# Patient Record
Sex: Male | Born: 1960 | Race: White | Hispanic: No | Marital: Single | Smoking: Former smoker
Health system: Southern US, Academic
[De-identification: ages and names within clinical notes are randomized; demographics above are authoritative.]

## PROBLEM LIST (undated history)

## (undated) DIAGNOSIS — G8929 Other chronic pain: Secondary | ICD-10-CM

## (undated) DIAGNOSIS — I10 Essential (primary) hypertension: Secondary | ICD-10-CM

## (undated) DIAGNOSIS — G4730 Sleep apnea, unspecified: Secondary | ICD-10-CM

## (undated) DIAGNOSIS — Z973 Presence of spectacles and contact lenses: Secondary | ICD-10-CM

## (undated) DIAGNOSIS — M129 Arthropathy, unspecified: Secondary | ICD-10-CM

## (undated) DIAGNOSIS — R569 Unspecified convulsions: Secondary | ICD-10-CM

## (undated) DIAGNOSIS — S82899A Other fracture of unspecified lower leg, initial encounter for closed fracture: Secondary | ICD-10-CM

## (undated) HISTORY — PX: HX GALL BLADDER SURGERY/CHOLE: SHX55

## (undated) HISTORY — DX: Other chronic pain: G89.29

## (undated) HISTORY — PX: HX KNEE REPLACMENT: SHX125

## (undated) HISTORY — PX: HX APPENDECTOMY: SHX54

---

## 1999-07-11 ENCOUNTER — Emergency Department (HOSPITAL_COMMUNITY): Payer: Self-pay

## 2010-07-30 ENCOUNTER — Ambulatory Visit (INDEPENDENT_AMBULATORY_CARE_PROVIDER_SITE_OTHER): Payer: Self-pay | Admitting: Neurology

## 2010-07-30 ENCOUNTER — Ambulatory Visit (INDEPENDENT_AMBULATORY_CARE_PROVIDER_SITE_OTHER): Payer: No Typology Code available for payment source | Admitting: Neurology

## 2021-07-22 ENCOUNTER — Encounter (HOSPITAL_COMMUNITY): Payer: Self-pay

## 2021-07-22 ENCOUNTER — Other Ambulatory Visit: Payer: Self-pay

## 2021-07-22 ENCOUNTER — Inpatient Hospital Stay
Admission: EM | Admit: 2021-07-22 | Discharge: 2021-07-23 | DRG: 101 | Discharge status: Against Medical Advice | Payer: BC Managed Care – PPO | Attending: Internal Medicine | Admitting: Internal Medicine

## 2021-07-22 DIAGNOSIS — Z87891 Personal history of nicotine dependence: Secondary | ICD-10-CM

## 2021-07-22 DIAGNOSIS — G4730 Sleep apnea, unspecified: Secondary | ICD-10-CM | POA: Diagnosis present

## 2021-07-22 DIAGNOSIS — Z20822 Contact with and (suspected) exposure to covid-19: Secondary | ICD-10-CM | POA: Diagnosis present

## 2021-07-22 DIAGNOSIS — I10 Essential (primary) hypertension: Secondary | ICD-10-CM | POA: Diagnosis present

## 2021-07-22 DIAGNOSIS — R569 Unspecified convulsions: Principal | ICD-10-CM | POA: Diagnosis present

## 2021-07-22 HISTORY — DX: Sleep apnea, unspecified: G47.30

## 2021-07-22 HISTORY — DX: Presence of spectacles and contact lenses: Z97.3

## 2021-07-22 HISTORY — DX: Essential (primary) hypertension: I10

## 2021-07-22 HISTORY — DX: Unspecified convulsions (CMS HCC): R56.9

## 2021-07-22 HISTORY — DX: Arthropathy, unspecified: M12.9

## 2021-07-22 NOTE — ED Attending Note (Signed)
[facility]  Emergency Department  Attending Provider Note      CHIEF COMPLAINT  Chief Complaint   Patient presents with    Seizure- New Onset    Nausea    Light Sensitivity     HISTORY OF PRESENT ILLNESS  Steven Mendez, date of birth 10-09-1960, is a 61 y.o. male who presented to the Emergency Department after he had seizure-like activity at home.  Family reports patient has had multiple episodes over the past few months.  The patient describes a left-sided headache that precedes the activity.  He will then lose consciousness briefly.  Recently, he has began to have shaking in his left hand and arm.  He does appear to have a postictal phase after the seizure activity stopped.  Family reports the patient remains unresponsive for a few minutes.  Apparently, the patient is awake and alert.  He reports 2-3 hours of being very tired after each episode.  He states he has had a CT and MRI in the past month that were both normal.  He states he also had an EEG 1 week ago but is unaware of the results.  He is not currently taking any medications.    PAST MEDICAL/SURGICAL/FAMILY/SOCIAL HISTORY  Past Medical History:   Diagnosis Date    Arthropathy     Convulsions (CMS HCC)     HTN (hypertension)     Sleep apnea     Wears glasses        Past Surgical History:   Procedure Laterality Date    HX APPENDECTOMY      HX CHOLECYSTECTOMY      HX KNEE REPLACMENT Left        Family Medical History:    None       Social History     Socioeconomic History    Marital status: Single   Tobacco Use    Smoking status: Former     Packs/day: 1.00     Types: Cigarettes     Start date: 1982     Quit date: 2021     Years since quitting: 2.1    Smokeless tobacco: Never   Vaping Use    Vaping Use: Never used   Substance and Sexual Activity    Alcohol use: Yes     Comment: occasional    Drug use: Never    Sexual activity: Not Currently      ALLERGIES  No Known Allergies    PHYSICAL EXAM  VITAL SIGNS:  Filed  Vitals:    07/22/21 2057   BP: 134/85   Pulse: 79   Resp: 18   Temp: 37.2 C (98.9 F)   SpO2: 96%     GENERAL: PATIENT IS ALERT AND ORIENTED TO PERSON, PLACE, AND TIME.  HEAD: NORMOCEPHALIC AND ATRAUMATIC.  EYES: PUPILS EQUALLY ROUND AND REACT TO LIGHT. EXTRAOCULAR MOVEMENTS INTACT.  EARS: GROSS HEARING INTACT. EXTERNAL EARS WITHIN NORMAL LIMITS.  NOSE: NO SEPTAL DEVIATION. NASAL PASSAGES CLEAR.  THROAT: MOIST ORAL MUCOSA. NO ERYTHEMA OR EXUDATE OF THE PHARYNX.  NECK: SUPPLE. TRACHEA MIDLINE.  HEART: REGULAR, RATE, AND RHYTHM.  LUNGS: CLEAR TO AUSCULTATION BILATERAL.  ABDOMEN: SOFT, NON-TENDER, NON-DISTENDED, AND BOWEL SOUNDS ARE PRESENT.  GENITOURINARY: DEFERRED.  RECTAL: DEFERRED.  EXTREMITIES: NO CYANOSIS, CLUBBING, OR EDEMA.  SKIN: WARM AND DRY.  MUSCULOSKELETAL: DEFERRED.  NEUROLOGIC: CRANIAL NERVES II THROUGH XII ARE INTACT. SENSATION TO LIGHT TOUCH IS INTACT.  Strength 5/5 in all 4 extremities.  There is no pronator drift.  The patient does have a very slight horizontal nystagmus with rapid component to the right. Finger-to-nose ataxia with bilat.    PSYCHIATRIC: JUDGMENT AND INSIGHT ARE SEEMINGLY INTACT. MOOD AND AFFECT ARE APPROPRIATE FOR THE SITUATION.    DIAGNOSTICS  Labs:  Labs listed below were reviewed and interpreted by me.  Results for orders placed or performed during the hospital encounter of 07/22/21   COMPREHENSIVE METABOLIC PANEL, NON-FASTING   Result Value Ref Range    SODIUM 138 136 - 145 mmol/L    POTASSIUM 3.9 3.5 - 5.1 mmol/L    CHLORIDE 104 98 - 107 mmol/L    CO2 TOTAL 24 21 - 31 mmol/L    ANION GAP 10 10 - 20 mmol/L    BUN 13 7 - 25 mg/dL    CREATININE 0.76 0.60 - 1.30 mg/dL    BUN/CREA RATIO 17 6 - 22    ESTIMATED GFR 103 >59 mL/min/1.49m2    ALBUMIN 4.3 3.5 - 5.7 g/dL    CALCIUM 9.2 8.6 - 10.3 mg/dL    GLUCOSE 97 74 - 109 mg/dL    ALKALINE PHOSPHATASE 74 34 - 104 U/L    ALT (SGPT) 22 7 - 52 U/L    AST (SGOT) 17 13 - 39 U/L    BILIRUBIN TOTAL 0.7 0.3 - 1.2 mg/dL    PROTEIN TOTAL 7.1  6.4 - 8.9 g/dL    ALBUMIN/GLOBULIN RATIO 1.5 (H) 0.8 - 1.4    OSMOLALITY, CALCULATED 276 270 - 290 mOsm/kg    CALCIUM, CORRECTED 8.9 8.9 - 10.8 mg/dL    GLOBULIN 2.8 (L) 2.9 - 5.4   MAGNESIUM   Result Value Ref Range    MAGNESIUM 1.9 1.9 - 2.7 mg/dL   CREATINE KINASE (CK), TOTAL, SERUM   Result Value Ref Range    CREATINE KINASE 107 30 - 223 U/L   ECG 12 LEAD   Result Value Ref Range    Ventricular rate 65 BPM    Atrial Rate 65 BPM    PR Interval 194 ms    QRS Duration 88 ms    QT Interval 382 ms    QTC Calculation 397 ms    Calculated P Axis 69 degrees    Calculated R Axis 72 degrees    Calculated T Axis 48 degrees                  Medical Decision Making  Amount and/or Complexity of Data Reviewed  Independent Historian: spouse  Labs: ordered.     Details: CT and MRI results requested but not obtained.  Radiology: ordered.  ECG/medicine tests: ordered.      Risk  Decision regarding hospitalization.        CLINICAL IMPRESSION  Clinical Impression   Seizure (CMS [facility]) (Primary)     DISPOSITION  Admitted       DISCHARGE MEDICATIONS  Current Discharge Medication List          Quita Skye Sabra Heck D.O.   07/22/2021, 23:34   [facility]  Department of Emergency Medicine  [facility]    This note was partially generated using MModal Fluency Direct system, and there may be some incorrect words, spellings, and punctuation that were not noted in checking the note before saving.    -----

## 2021-07-22 NOTE — ED Triage Notes (Signed)
Given 4mg  Zofran IV in route via EMS

## 2021-07-22 NOTE — ED Triage Notes (Addendum)
Brought to ED via [facility] for seizure like activity x6 months. Family reports pt has episodes of staring followed by confusion. PCP instructed pt to come to ED for workup for seizures. Pt is complaining of light sensitivity and nausea after episode tonight.

## 2021-07-23 DIAGNOSIS — M129 Arthropathy, unspecified: Secondary | ICD-10-CM

## 2021-07-23 DIAGNOSIS — R569 Unspecified convulsions: Secondary | ICD-10-CM | POA: Diagnosis present

## 2021-07-23 DIAGNOSIS — Z9049 Acquired absence of other specified parts of digestive tract: Secondary | ICD-10-CM

## 2021-07-23 DIAGNOSIS — Z87891 Personal history of nicotine dependence: Secondary | ICD-10-CM

## 2021-07-23 DIAGNOSIS — G4730 Sleep apnea, unspecified: Secondary | ICD-10-CM

## 2021-07-23 DIAGNOSIS — I10 Essential (primary) hypertension: Secondary | ICD-10-CM

## 2021-07-23 LAB — COMPREHENSIVE METABOLIC PANEL, NON-FASTING
ALBUMIN/GLOBULIN RATIO: 1.5 — ABNORMAL HIGH (ref 0.8–1.4)
ALBUMIN: 4.3 g/dL (ref 3.5–5.7)
ALKALINE PHOSPHATASE: 74 U/L (ref 34–104)
ALT (SGPT): 22 U/L (ref 7–52)
ANION GAP: 10 mmol/L (ref 10–20)
AST (SGOT): 17 U/L (ref 13–39)
BILIRUBIN TOTAL: 0.7 mg/dL (ref 0.3–1.2)
BUN/CREA RATIO: 17 (ref 6–22)
BUN: 13 mg/dL (ref 7–25)
CALCIUM, CORRECTED: 8.9 mg/dL (ref 8.9–10.8)
CALCIUM: 9.2 mg/dL (ref 8.6–10.3)
CHLORIDE: 104 mmol/L (ref 98–107)
CO2 TOTAL: 24 mmol/L (ref 21–31)
CREATININE: 0.76 mg/dL (ref 0.60–1.30)
ESTIMATED GFR: 103 mL/min/{1.73_m2} (ref >59)
GLOBULIN: 2.8 — ABNORMAL LOW (ref 2.9–5.4)
GLUCOSE: 97 mg/dL (ref 74–109)
OSMOLALITY, CALCULATED: 276 mOsm/kg (ref 270–290)
POTASSIUM: 3.9 mmol/L (ref 3.5–5.1)
PROTEIN TOTAL: 7.1 g/dL (ref 6.4–8.9)
SODIUM: 138 mmol/L (ref 136–145)

## 2021-07-23 LAB — CBC WITH DIFF
BASOPHIL #: 0.1 10*3/uL (ref 0.00–2.50)
BASOPHIL %: 1 % (ref 0–3)
EOSINOPHIL #: 0.2 10*3/uL (ref 0.00–2.40)
EOSINOPHIL %: 2 % (ref 0–7)
HCT: 44 % (ref 42.0–51.0)
HGB: 15.3 g/dL (ref 13.5–18.0)
LYMPHOCYTE #: 2.3 10*3/uL (ref 2.10–11.00)
LYMPHOCYTE %: 21 % — ABNORMAL LOW (ref 25–45)
MCH: 30.6 pg (ref 27.0–32.0)
MCHC: 34.7 g/dL (ref 32.0–36.0)
MCV: 88.2 fL (ref 78.0–99.0)
MONOCYTE #: 0.6 10*3/uL (ref 0.00–4.10)
MONOCYTE %: 6 % (ref 0–12)
MPV: 7.8 fL (ref 7.4–10.4)
NEUTROPHIL #: 7.5 10*3/uL (ref 4.10–29.00)
NEUTROPHIL %: 70 % (ref 40–76)
PLATELET COMMENT: NORMAL
PLATELETS: 272 10*3/uL (ref 140–440)
RBC COMMENT: NORMAL
RBC: 4.99 10*6/uL (ref 4.20–6.00)
RDW: 13.9 % (ref 11.6–14.8)
WBC: 10.8 10*3/uL — ABNORMAL HIGH (ref 4.0–10.5)
WBCS UNCORRECTED: 10.8 10*3/uL

## 2021-07-23 LAB — ECG 12 LEAD
Atrial Rate: 65 {beats}/min
Calculated P Axis: 69 degrees
Calculated R Axis: 72 degrees
Calculated T Axis: 48 degrees
PR Interval: 194 ms
QRS Duration: 88 ms
QT Interval: 382 ms
QTC Calculation: 397 ms
Ventricular rate: 65 {beats}/min

## 2021-07-23 LAB — COVID-19, FLU A/B, RSV RAPID BY PCR
INFLUENZA VIRUS TYPE A: NOT DETECTED
INFLUENZA VIRUS TYPE B: NOT DETECTED
RESPIRATORY SYNCTIAL VIRUS (RSV): NOT DETECTED
SARS-CoV-2: NOT DETECTED

## 2021-07-23 LAB — MAGNESIUM: MAGNESIUM: 1.9 mg/dL (ref 1.9–2.7)

## 2021-07-23 LAB — CREATINE KINASE (CK), TOTAL, SERUM: CREATINE KINASE: 107 U/L (ref 30–223)

## 2021-07-23 LAB — THYROID STIMULATING HORMONE WITH FREE T4 REFLEX: TSH: 3.905 u[IU]/mL (ref 0.450–5.330)

## 2021-07-23 MED ORDER — LEVETIRACETAM 500 MG TABLET
500.0000 mg | ORAL_TABLET | Freq: Two times a day (BID) | ORAL | Status: DC
Start: 2021-07-23 — End: 2021-07-23
  Administered 2021-07-23: 500 mg via ORAL

## 2021-07-23 MED ORDER — SODIUM CHLORIDE 0.9 % INTRAVENOUS SOLUTION
INTRAVENOUS | Status: DC
Start: 2021-07-23 — End: 2021-07-23

## 2021-07-23 MED ORDER — LEVETIRACETAM 500 MG TABLET
ORAL_TABLET | ORAL | Status: AC
Start: 2021-07-23 — End: 2021-07-23
  Filled 2021-07-23: qty 1

## 2021-07-23 NOTE — H&P (Signed)
[facility]  History and Physical    Date of Service:  07/23/21   Chandan, Fly, 61 y.o. male  Encounter Start Date:  07/22/2021  Inpatient Admission Date: 07/23/2021  Date of Birth:  Sep 07, 1960  PCP: Meryle Ready, MD    Chief Complaint:  Seizures    HPI: Steven Mendez is a 61 y.o., White male who presents with worsening onset of seizure-like activity.  Patient has been having these episodes for greater than 3 years.  Patient usually develops a left-sided headache and will have some left-sided arm movement and becomes acutely confused.  According the family it takes 45 minutes to an hour for him to come out of this confusion state.  Dr. Hyacinth Meeker had conversation with tele Neuro Dr. Francisco Capuchin from [facility].  Patient has had CT, MRI and EEG completed that [facility].  The ED is trying to have these records faxed over at present.  Tele Neuro would like to have these reports and do a complete walk around tele Neuro visit with patient in the a.m. once the reports are in hand.  Patient is agreeable to stay.  Patient to be loaded with Keppra and admitted to telemetry.    Past Medical History:    Past Medical History:   Diagnosis Date   . Arthropathy    . Convulsions (CMS HCC)    . HTN (hypertension)    . Sleep apnea    . Wears glasses              Medications Prior to Admission     Prescriptions    esomeprazole magnesium (NEXIUM) 40 mg Oral Capsule, Delayed Release(E.C.)    Take 1 Capsule (40 mg total) by mouth Every morning before breakfast    gabapentin (NEURONTIN) 400 mg Oral Capsule    Take 2 Capsules (800 mg total) by mouth Three times a day    lisinopriL (PRINIVIL) 20 mg Oral Tablet    Take 1 Tablet (20 mg total) by mouth Once a day    naproxen (NAPROSYN) 500 mg Oral Tablet    Take 1 Tablet (500 mg total) by mouth Twice daily with food    ondansetron (ZOFRAN ODT) 4 mg Oral Tablet, Rapid Dissolve    Take 1 Tablet (4 mg total) by mouth Every 8 hours as needed for Nausea/Vomiting    sucralfate  (CARAFATE) 1 gram Oral Tablet    Take 1 Tablet (1 g total) by mouth Every 6 hours        No Known Allergies    Past Surgical History:  Past Surgical History:   Procedure Laterality Date   . HX APPENDECTOMY     . HX CHOLECYSTECTOMY     . HX KNEE REPLACMENT Left            Family History:  Family Medical History:    None            Social History:  Social History     Tobacco Use   . Smoking status: Former     Packs/day: 1.00     Types: Cigarettes     Start date: 39     Quit date: 2021     Years since quitting: 2.1   . Smokeless tobacco: Never   Vaping Use   . Vaping Use: Never used   Substance Use Topics   . Alcohol use: Yes     Comment: occasional   . Drug use: Never  Review of Systems:  General: No fever or chills. No weight changes, fatigue, weakness.   HEENT: No headaches, dizziness, changes in vision, changes in hearing, or difficulty swallowing.    Skin:  No rashes, erythema or bruises.   Cardiac: No chest pain, palpitations, or arrhythmia.    Respiratory: No shortness of breath, cough, or wheezing.  GI: No nausea or vomiting. No abdominal pain.   Urinary: No dysuria, hematuria, or change in frequency.    Vascular: No edema.     Musculoskeletal: No muscle weakness, pain, or decreased range of motion.   Neurologic: No loss of sensation, numbness or tingling.   Endocrine: No heat or cold intolerance or polydipsia.   Psychiatric: No insomnia, depression or anxiety.  Examination:  BP 134/85   Pulse 79   Temp 37.2 C (98.9 F)   Resp 18   Ht 1.727 m (5\' 8" )   Wt 99.8 kg (220 lb)   SpO2 96%   BMI 33.45 kg/m       Filed Vitals:    07/22/21 2057   BP: 134/85   Pulse: 79   Resp: 18   Temp: 37.2 C (98.9 F)   SpO2: 96%      General: Patient is alert and oriented to person, place, and time. No acute distress. Communicates appropriately.   Head: Normocephalic and atraumatic.    Eyes: Pupils equally round and react to light and accommodate. Extraocular movements intact.  Conjunctiva normal. Sclerae are  normal.    Nose: Nasal passages clear. Mucosa moist.    Throat: Moist oral mucosa. No erythema or exudate of the pharynx. Clear oropharynx.    Neck: Supple. No cervical lymphadenopathy or supraclavicular nodes detected. Trachea midline   Heart: Regular rate and rhythm. S1 & S2 present. No S3 or S4. No rubs, gallops, or murmurs appreciated.  Radial and dorsalis pedis pulses +2/4 bilaterally.  Brisk capillary refill.    Lungs: Clear to auscultation bilaterally with no wheezes or rales. Equal chest excursion.  No conversational dyspnea. No respiratory distress noted.   Abdomen: Soft, nontender, nondistended belly. Bowel sounds are present in all four quadrants. No rigidity.  No guarding.  No ascites.   Extremities: No edema, cyanosis, or clubbing. Grossly moves all extremities.    Skin: Warm and dry without lesions. No ecchymosis noted.    Neurologic: Cranial nerves II through XII are grossly intact. Sensation to light touch is intact. Strength 5/5 in upper extremities and lower extremities bilaterally.    Genitourinary:  No urinary incontinence or Foley catheter   Psychiatric: Judgment and insight are intact. Mood and affect are appropriate for the situation.       Labs:    Results for orders placed or performed during the hospital encounter of 07/22/21 (from the past 24 hour(s))   ECG 12 LEAD   Result Value Ref Range    Ventricular rate 65 BPM    Atrial Rate 65 BPM    PR Interval 194 ms    QRS Duration 88 ms    QT Interval 382 ms    QTC Calculation 397 ms    Calculated P Axis 69 degrees    Calculated R Axis 72 degrees    Calculated T Axis 48 degrees   COMPREHENSIVE METABOLIC PANEL, NON-FASTING   Result Value Ref Range    SODIUM 138 136 - 145 mmol/L    POTASSIUM 3.9 3.5 - 5.1 mmol/L    CHLORIDE 104 98 - 107 mmol/L    CO2 TOTAL  24 21 - 31 mmol/L    ANION GAP 10 10 - 20 mmol/L    BUN 13 7 - 25 mg/dL    CREATININE 1.610.76 0.960.60 - 1.30 mg/dL    BUN/CREA RATIO 17 6 - 22    ESTIMATED GFR 103 >59 mL/min/1.3810m^2    ALBUMIN 4.3  3.5 - 5.7 g/dL    CALCIUM 9.2 8.6 - 04.510.3 mg/dL    GLUCOSE 97 74 - 409109 mg/dL    ALKALINE PHOSPHATASE 74 34 - 104 U/L    ALT (SGPT) 22 7 - 52 U/L    AST (SGOT) 17 13 - 39 U/L    BILIRUBIN TOTAL 0.7 0.3 - 1.2 mg/dL    PROTEIN TOTAL 7.1 6.4 - 8.9 g/dL    ALBUMIN/GLOBULIN RATIO 1.5 (H) 0.8 - 1.4    OSMOLALITY, CALCULATED 276 270 - 290 mOsm/kg    CALCIUM, CORRECTED 8.9 8.9 - 10.8 mg/dL    GLOBULIN 2.8 (L) 2.9 - 5.4   MAGNESIUM   Result Value Ref Range    MAGNESIUM 1.9 1.9 - 2.7 mg/dL   CREATINE KINASE (CK), TOTAL, SERUM   Result Value Ref Range    CREATINE KINASE 107 30 - 223 U/L   CBC WITH DIFF   Result Value Ref Range    WBCS UNCORRECTED 10.8 x10^3/uL    WBC 10.8 (H) 4.0 - 10.5 x10^3/uL    RBC 4.99 4.20 - 6.00 x10^6/uL    HGB 15.3 13.5 - 18.0 g/dL    HCT 81.144.0 91.442.0 - 78.251.0 %    MCV 88.2 78.0 - 99.0 fL    MCH 30.6 27.0 - 32.0 pg    MCHC 34.7 32.0 - 36.0 g/dL    RDW 95.613.9 21.311.6 - 08.614.8 %    PLATELETS 272 140 - 440 x10^3/uL    MPV 7.8 7.4 - 10.4 fL    NEUTROPHIL % 70 40 - 76 %    LYMPHOCYTE % 21 (L) 25 - 45 %    MONOCYTE % 6 0 - 12 %    EOSINOPHIL % 2 0 - 7 %    BASOPHIL % 1 0 - 3 %    NEUTROPHIL # 7.50 4.10 - 29.00 x10^3/uL    LYMPHOCYTE # 2.30 2.10 - 11.00 x10^3/uL    MONOCYTE # 0.60 0.00 - 4.10 x10^3/uL    EOSINOPHIL # 0.20 0.00 - 2.40 x10^3/uL    BASOPHIL # 0.10 0.00 - 2.50 x10^3/uL    RBC COMMENT NORMAL     PLATELET COMMENT NORMAL, LARGE PLATELETS SEEN        Imaging Studies:   ECG 12 LEAD  Normal sinus rhythm  Normal ECG  No previous ECGs available       Assessment/Plan:   Active Hospital Problems    Diagnosis   . Primary Problem: [facility]     Patient be admitted to telemetry.  Will [facility] CT[facility] MRI [facility]  Patient has follow-up with Dr. Andee PolesVaught there in the past.  Repeat labs ordered for the a.m.Marland Kitchen.  Keppra to be started.  Seizure precautions.  Regular diet to be ordered.  DVT/PE Prophylaxis:  Early ambulation  Burman RiisPhillip Khaled Herda, FNP-BC  This note was partially generated  using MModal Fluency Direct system, and there may be some incorrect words, spellings, and punctuation that were not noted in checking the note before saving.

## 2021-09-12 ENCOUNTER — Emergency Department
Admission: EM | Admit: 2021-09-12 | Discharge: 2021-09-13 | Disposition: A | Discharge status: Other Acute Inpatient Hospital | Payer: Worker's Comp, Other unspecified | Attending: Emergency Medicine | Admitting: Emergency Medicine

## 2021-09-12 ENCOUNTER — Emergency Department (HOSPITAL_COMMUNITY): Payer: Worker's Comp, Other unspecified

## 2021-09-12 ENCOUNTER — Other Ambulatory Visit: Payer: Self-pay

## 2021-09-12 DIAGNOSIS — S32019A Unspecified fracture of first lumbar vertebra, initial encounter for closed fracture: Secondary | ICD-10-CM

## 2021-09-12 DIAGNOSIS — S32049A Unspecified fracture of fourth lumbar vertebra, initial encounter for closed fracture: Secondary | ICD-10-CM | POA: Insufficient documentation

## 2021-09-12 DIAGNOSIS — S32010A Wedge compression fracture of first lumbar vertebra, initial encounter for closed fracture: Secondary | ICD-10-CM | POA: Insufficient documentation

## 2021-09-12 DIAGNOSIS — S32009A Unspecified fracture of unspecified lumbar vertebra, initial encounter for closed fracture: Secondary | ICD-10-CM

## 2021-09-12 DIAGNOSIS — Z87891 Personal history of nicotine dependence: Secondary | ICD-10-CM | POA: Insufficient documentation

## 2021-09-12 DIAGNOSIS — S2231XA Fracture of one rib, right side, initial encounter for closed fracture: Secondary | ICD-10-CM | POA: Insufficient documentation

## 2021-09-12 DIAGNOSIS — S32039A Unspecified fracture of third lumbar vertebra, initial encounter for closed fracture: Secondary | ICD-10-CM | POA: Insufficient documentation

## 2021-09-12 DIAGNOSIS — Y92411 Interstate highway as the place of occurrence of the external cause: Secondary | ICD-10-CM | POA: Insufficient documentation

## 2021-09-12 DIAGNOSIS — S82831A Other fracture of upper and lower end of right fibula, initial encounter for closed fracture: Secondary | ICD-10-CM | POA: Insufficient documentation

## 2021-09-12 DIAGNOSIS — S32029A Unspecified fracture of second lumbar vertebra, initial encounter for closed fracture: Secondary | ICD-10-CM | POA: Insufficient documentation

## 2021-09-12 DIAGNOSIS — M47812 Spondylosis without myelopathy or radiculopathy, cervical region: Secondary | ICD-10-CM | POA: Insufficient documentation

## 2021-09-12 DIAGNOSIS — Y990 Civilian activity done for income or pay: Secondary | ICD-10-CM | POA: Insufficient documentation

## 2021-09-12 DIAGNOSIS — R55 Syncope and collapse: Secondary | ICD-10-CM

## 2021-09-12 LAB — PTT (PARTIAL THROMBOPLASTIN TIME): APTT: 32 seconds (ref 26.0–36.0)

## 2021-09-12 LAB — CBC WITH DIFF
BASOPHIL #: 0.2 10*3/uL (ref 0.00–0.30)
BASOPHIL %: 1 % (ref 0–3)
EOSINOPHIL #: 0.2 10*3/uL (ref 0.00–0.80)
EOSINOPHIL %: 1 % (ref 0–7)
HCT: 39.8 % — ABNORMAL LOW (ref 42.0–51.0)
HGB: 14.1 g/dL (ref 13.5–18.0)
LYMPHOCYTE #: 3.4 10*3/uL (ref 1.10–5.00)
LYMPHOCYTE %: 23 % — ABNORMAL LOW (ref 25–45)
MCH: 31 pg (ref 27.0–32.0)
MCHC: 35.4 g/dL (ref 32.0–36.0)
MCV: 87.6 fL (ref 78.0–99.0)
MONOCYTE #: 0.5 10*3/uL (ref 0.00–1.30)
MONOCYTE %: 4 % (ref 0–12)
MPV: 7.4 fL (ref 7.4–10.4)
NEUTROPHIL #: 10.4 10*3/uL — ABNORMAL HIGH (ref 1.80–8.40)
NEUTROPHIL %: 71 % (ref 40–76)
PLATELETS: 292 10*3/uL (ref 140–440)
RBC: 4.55 10*6/uL (ref 4.20–6.00)
RDW: 13.4 % (ref 11.6–14.8)
WBC: 14.6 10*3/uL — ABNORMAL HIGH (ref 4.0–10.5)
WBCS UNCORRECTED: 14.6 10*3/uL

## 2021-09-12 LAB — BASIC METABOLIC PANEL
ANION GAP: 8 mmol/L — ABNORMAL LOW (ref 10–20)
BUN/CREA RATIO: 9 (ref 6–22)
BUN: 7 mg/dL (ref 7–25)
CALCIUM: 8.6 mg/dL (ref 8.6–10.3)
CHLORIDE: 108 mmol/L — ABNORMAL HIGH (ref 98–107)
CO2 TOTAL: 23 mmol/L (ref 21–31)
CREATININE: 0.76 mg/dL (ref 0.60–1.30)
ESTIMATED GFR: 102 mL/min/{1.73_m2} (ref >59)
GLUCOSE: 128 mg/dL — ABNORMAL HIGH (ref 74–109)
OSMOLALITY, CALCULATED: 277 mOsm/kg (ref 270–290)
POTASSIUM: 3.3 mmol/L — ABNORMAL LOW (ref 3.5–5.1)
SODIUM: 139 mmol/L (ref 136–145)

## 2021-09-12 LAB — PT/INR
INR: 1.01 (ref <=5.00)
PROTHROMBIN TIME: 11.7 seconds (ref 9.8–12.7)

## 2021-09-12 LAB — HEPATIC FUNCTION PANEL
ALBUMIN/GLOBULIN RATIO: 1.7 — ABNORMAL HIGH (ref 0.8–1.4)
ALBUMIN: 4 g/dL (ref 3.5–5.7)
ALKALINE PHOSPHATASE: 69 U/L (ref 34–104)
ALT (SGPT): 60 U/L — ABNORMAL HIGH (ref 7–52)
AST (SGOT): 74 U/L — ABNORMAL HIGH (ref 13–39)
BILIRUBIN DIRECT: 0.07 md/dL (ref <=0.20)
BILIRUBIN TOTAL: 0.5 mg/dL (ref 0.3–1.2)
BILIRUBIN, INDIRECT: 0.43 mg/dL (ref <=1)
GLOBULIN: 2.4 — ABNORMAL LOW (ref 2.9–5.4)
PROTEIN TOTAL: 6.4 g/dL (ref 6.4–8.9)

## 2021-09-12 LAB — TYPE AND SCREEN
ABO/RH(D): O POS
ANTIBODY SCREEN: NEGATIVE

## 2021-09-12 LAB — LACTIC ACID LEVEL: LACTIC ACID: 2.7 mmol/L — ABNORMAL HIGH (ref 0.5–2.2)

## 2021-09-12 LAB — LIPASE: LIPASE: 33 U/L (ref 11–82)

## 2021-09-12 LAB — ETHANOL, SERUM: ETHANOL: <10 mg/dL — ABNORMAL HIGH

## 2021-09-12 MED ORDER — FENTANYL (PF) 50 MCG/ML INJECTION SOLUTION
25.0000 ug | INTRAMUSCULAR | Status: AC
Start: 2021-09-13 — End: 2021-09-13
  Administered 2021-09-13: 25 ug via INTRAVENOUS

## 2021-09-12 MED ORDER — IOPAMIDOL 370 MG IODINE/ML (76 %) INTRAVENOUS SOLUTION
100.0000 mL | INTRAVENOUS | Status: AC
Start: 2021-09-12 — End: 2021-09-12
  Administered 2021-09-12: 100 mL via INTRAVENOUS

## 2021-09-12 MED ORDER — ONDANSETRON HCL (PF) 4 MG/2 ML INJECTION SOLUTION
4.0000 mg | INTRAMUSCULAR | Status: AC
Start: 2021-09-13 — End: 2021-09-12
  Administered 2021-09-12: 4 mg via INTRAVENOUS

## 2021-09-12 MED ORDER — ONDANSETRON HCL (PF) 4 MG/2 ML INJECTION SOLUTION
INTRAMUSCULAR | Status: AC
Start: 2021-09-12 — End: 2021-09-12
  Filled 2021-09-12: qty 2

## 2021-09-12 MED ORDER — FENTANYL (PF) 50 MCG/ML INJECTION SOLUTION
INTRAMUSCULAR | Status: AC
Start: 2021-09-12 — End: 2021-09-12
  Filled 2021-09-12: qty 2
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
## 2021-09-13 ENCOUNTER — Emergency Department (HOSPITAL_COMMUNITY): Payer: Worker's Comp, Other unspecified

## 2021-09-13 MED ORDER — HYDROMORPHONE 2 MG/ML INJECTION WRAPPER
INJECTION | INTRAMUSCULAR | Status: AC
Start: 2021-09-13 — End: 2021-09-13
  Filled 2021-09-13: qty 1

## 2021-09-13 MED ORDER — HYDROMORPHONE 2 MG/ML INJECTION WRAPPER
1.0000 mg | INJECTION | INTRAMUSCULAR | Status: AC
Start: 2021-09-13 — End: 2021-09-13
  Administered 2021-09-13: 1 mg via INTRAVENOUS

## 2021-09-13 NOTE — ED Nurses Note (Signed)
Report called to charge nurse at [facility].

## 2021-09-13 NOTE — ED Nurses Note (Signed)
Pt. Taken off back board at this time. Provider at bedside. Cervical spine protection maintained.

## 2021-09-13 NOTE — ED Nurses Note (Signed)
[facility] transport here to transfer patient to [facility] at this time.

## 2021-09-22 ENCOUNTER — Other Ambulatory Visit (HOSPITAL_COMMUNITY): Admit: 2021-09-22 | Discharge: 2021-09-22 | Disposition: A | Discharge status: Home / Self Care | Payer: Self-pay

## 2021-09-23 ENCOUNTER — Other Ambulatory Visit: Payer: BC Managed Care – PPO

## 2021-09-23 LAB — CBC/DIFF - CLIENT CONSOLIDATED
BASOPHIL #: 0.2 10*3/uL (ref 0.00–0.30)
BASOPHIL %: 1 % (ref 0–3)
EOSINOPHIL #: 0.4 10*3/uL (ref 0.00–0.80)
EOSINOPHIL %: 3 % (ref 0–7)
HCT: 36.8 % — ABNORMAL LOW (ref 42.0–51.0)
HGB: 12.5 g/dL — ABNORMAL LOW (ref 13.5–18.0)
LYMPHOCYTE #: 1.6 10*3/uL (ref 1.10–5.00)
LYMPHOCYTE %: 12 % — ABNORMAL LOW (ref 25–45)
MCH: 29.8 pg (ref 27.0–32.0)
MCHC: 34 g/dL (ref 32.0–36.0)
MCV: 87.6 fL (ref 78.0–99.0)
MONOCYTE #: 1 10*3/uL (ref 0.00–1.30)
MONOCYTE %: 8 % (ref 0–12)
MPV: 8 fL (ref 7.4–10.4)
NEUTROPHIL #: 10.1 10*3/uL — ABNORMAL HIGH (ref 1.80–8.40)
NEUTROPHIL %: 76 % (ref 40–76)
PLATELETS: 361 10*3/uL (ref 140–440)
RBC: 4.2 10*6/uL (ref 4.20–6.00)
RDW: 13.9 % (ref 11.6–14.8)
WBC: 13.2 10*3/uL
WBC: 13.2 10*3/uL — ABNORMAL HIGH (ref 4.0–10.5)

## 2021-09-23 LAB — CBC WITH DIFF
BASOPHIL #: 0.2 10*3/uL (ref 0.00–0.30)
BASOPHIL %: 1 % (ref 0–3)
EOSINOPHIL #: 0.4 10*3/uL (ref 0.00–0.80)
EOSINOPHIL %: 3 % (ref 0–7)
HCT: 36.8 % — ABNORMAL LOW (ref 42.0–51.0)
HGB: 12.5 g/dL — ABNORMAL LOW (ref 13.5–18.0)
LYMPHOCYTE #: 1.6 10*3/uL (ref 1.10–5.00)
LYMPHOCYTE %: 12 % — ABNORMAL LOW (ref 25–45)
MCH: 29.8 pg (ref 27.0–32.0)
MCHC: 34 g/dL (ref 32.0–36.0)
MCV: 87.6 fL (ref 78.0–99.0)
MONOCYTE #: 1 10*3/uL (ref 0.00–1.30)
MONOCYTE %: 8 % (ref 0–12)
MPV: 8 fL (ref 7.4–10.4)
NEUTROPHIL #: 10.1 10*3/uL — ABNORMAL HIGH (ref 1.80–8.40)
NEUTROPHIL %: 76 % (ref 40–76)
PLATELET COMMENT: NORMAL
PLATELETS: 361 10*3/uL (ref 140–440)
RBC COMMENT: NORMAL
RBC: 4.2 10*6/uL (ref 4.20–6.00)
RDW: 13.9 % (ref 11.6–14.8)
WBC: 13.2 10*3/uL — ABNORMAL HIGH (ref 4.0–10.5)
WBCS UNCORRECTED: 13.2 10*3/uL

## 2021-09-23 LAB — COMPREHENSIVE METABOLIC PANEL, NON-FASTING
ALBUMIN/GLOBULIN RATIO: 1.3 (ref 0.8–1.4)
ALBUMIN: 3.9 g/dL (ref 3.5–5.7)
ALKALINE PHOSPHATASE: 79 U/L (ref 34–104)
ALT (SGPT): 19 U/L (ref 7–52)
ANION GAP: 14 mmol/L (ref 10–20)
AST (SGOT): 16 U/L (ref 13–39)
BILIRUBIN TOTAL: 1.5 mg/dL — ABNORMAL HIGH (ref 0.3–1.2)
BUN/CREA RATIO: 17 (ref 6–22)
BUN: 17 mg/dL (ref 7–25)
CALCIUM, CORRECTED: 9.5 mg/dL (ref 8.9–10.8)
CALCIUM: 9.4 mg/dL (ref 8.6–10.3)
CHLORIDE: 98 mmol/L (ref 98–107)
CO2 TOTAL: 22 mmol/L (ref 21–31)
CREATININE: 1 mg/dL (ref 0.60–1.30)
ESTIMATED GFR: 86 mL/min/{1.73_m2} (ref >59)
GLOBULIN: 3.1 (ref 2.9–5.4)
GLUCOSE: 73 mg/dL — ABNORMAL LOW (ref 74–109)
OSMOLALITY, CALCULATED: 268 mOsm/kg — ABNORMAL LOW (ref 270–290)
POTASSIUM: 3.8 mmol/L (ref 3.5–5.1)
PROTEIN TOTAL: 7 g/dL (ref 6.4–8.9)
SODIUM: 134 mmol/L — ABNORMAL LOW (ref 136–145)

## 2021-09-25 ENCOUNTER — Other Ambulatory Visit: Payer: BC Managed Care – PPO

## 2021-09-25 LAB — MANUAL DIFFERENTIAL
EOSINOPHIL %: 2 % (ref 0–7)
EOSINOPHIL ABSOLUTE: 0.27 10*3/uL (ref 0.00–0.80)
EOSINOPHILS MANUAL: 2
LYMPHOCYTE %: 19 % — ABNORMAL LOW (ref 25–45)
LYMPHOCYTE ABSOLUTE: 2.57 10*3/uL (ref 1.10–5.00)
LYMPHOCYTES MANUAL: 19
MONOCYTE %: 11 % (ref 0–12)
MONOCYTE ABSOLUTE: 1.49 10*3/uL — ABNORMAL HIGH (ref 0.00–1.30)
MONOCYTES MANUAL: 11
NEUTROPHIL %: 68 % (ref 40–76)
NEUTROPHIL ABSOLUTE: 9.18 10*3/uL — ABNORMAL HIGH (ref 1.80–8.40)
NEUTROPHILS MANUAL: 68
PLATELET MORPHOLOGY COMMENT: NORMAL
RBC MORPHOLOGY COMMENT: NORMAL
TOTAL CELLS COUNTED [#] IN BLOOD: 100
WBC: 13.5 10*3/uL

## 2021-09-25 LAB — CBC WITH DIFF
HCT: 32.7 % — ABNORMAL LOW (ref 42.0–51.0)
HGB: 11.2 g/dL — ABNORMAL LOW (ref 13.5–18.0)
MCH: 29.7 pg (ref 27.0–32.0)
MCHC: 34.4 g/dL (ref 32.0–36.0)
MCV: 86.5 fL (ref 78.0–99.0)
MPV: 7.7 fL (ref 7.4–10.4)
PLATELETS: 360 10*3/uL (ref 140–440)
RBC: 3.78 10*6/uL — ABNORMAL LOW (ref 4.20–6.00)
RDW: 14.3 % (ref 11.6–14.8)
WBC: 13.5 10*3/uL — ABNORMAL HIGH (ref 4.0–10.5)
WBCS UNCORRECTED: 13.5 10*3/uL

## 2021-09-25 LAB — CBC/DIFF - CLIENT CONSOLIDATED
EOSINOPHIL %: 2 % (ref 0–7)
EOSINOPHIL ABSOLUTE: 0.27 10*3/uL (ref 0.00–0.80)
HCT: 32.7 % — ABNORMAL LOW (ref 42.0–51.0)
HGB: 11.2 g/dL — ABNORMAL LOW (ref 13.5–18.0)
LYMPHOCYTE %: 19 % — ABNORMAL LOW (ref 25–45)
LYMPHOCYTE ABSOLUTE: 2.57 10*3/uL (ref 1.10–5.00)
MCH: 29.7 pg (ref 27.0–32.0)
MCHC: 34.4 g/dL (ref 32.0–36.0)
MCV: 86.5 fL (ref 78.0–99.0)
MONOCYTE %: 11 % (ref 0–12)
MONOCYTE ABSOLUTE: 1.49 10*3/uL — ABNORMAL HIGH (ref 0.00–1.30)
MPV: 7.7 fL (ref 7.4–10.4)
NEUTROPHIL %: 68 % (ref 40–76)
NEUTROPHIL ABSOLUTE: 9.18 10*3/uL — ABNORMAL HIGH (ref 1.80–8.40)
PLATELET MORPHOLOGY COMMENT: NORMAL
PLATELETS: 360 10*3/uL (ref 140–440)
RBC MORPHOLOGY COMMENT: NORMAL
RBC: 3.78 10*6/uL — ABNORMAL LOW (ref 4.20–6.00)
RDW: 14.3 % (ref 11.6–14.8)
TOTAL CELLS COUNTED [#] IN BLOOD: 100
WBC: 13.5 10*3/uL
WBC: 13.5 10*3/uL — ABNORMAL HIGH (ref 4.0–10.5)

## 2021-09-25 LAB — COMPREHENSIVE METABOLIC PANEL, NON-FASTING
ALBUMIN/GLOBULIN RATIO: 1.3 (ref 0.8–1.4)
ALBUMIN: 3.6 g/dL (ref 3.5–5.7)
ALKALINE PHOSPHATASE: 80 U/L (ref 34–104)
ALT (SGPT): 17 U/L (ref 7–52)
ANION GAP: 10 mmol/L (ref 10–20)
AST (SGOT): 12 U/L — ABNORMAL LOW (ref 13–39)
BILIRUBIN TOTAL: 1.2 mg/dL (ref 0.3–1.2)
BUN/CREA RATIO: 15 (ref 6–22)
BUN: 18 mg/dL (ref 7–25)
CALCIUM, CORRECTED: 9.5 mg/dL (ref 8.9–10.8)
CALCIUM: 9.1 mg/dL (ref 8.6–10.3)
CHLORIDE: 100 mmol/L (ref 98–107)
CO2 TOTAL: 24 mmol/L (ref 21–31)
CREATININE: 1.21 mg/dL (ref 0.60–1.30)
ESTIMATED GFR: 68 mL/min/{1.73_m2} (ref >59)
GLOBULIN: 2.8 — ABNORMAL LOW (ref 2.9–5.4)
GLUCOSE: 113 mg/dL — ABNORMAL HIGH (ref 74–109)
OSMOLALITY, CALCULATED: 271 mOsm/kg (ref 270–290)
POTASSIUM: 3.5 mmol/L (ref 3.5–5.1)
PROTEIN TOTAL: 6.4 g/dL (ref 6.4–8.9)
SODIUM: 134 mmol/L — ABNORMAL LOW (ref 136–145)

## 2021-09-26 ENCOUNTER — Inpatient Hospital Stay
Admission: RE | Admit: 2021-09-26 | Discharge: 2021-09-26 | Disposition: A | Discharge status: Home / Self Care | Payer: BC Managed Care – PPO | Source: Ambulatory Visit

## 2021-09-26 ENCOUNTER — Inpatient Hospital Stay (HOSPITAL_COMMUNITY)
Admission: RE | Admit: 2021-09-26 | Discharge: 2021-09-26 | Disposition: A | Discharge status: Home / Self Care | Payer: BC Managed Care – PPO | Source: Ambulatory Visit

## 2021-09-26 ENCOUNTER — Other Ambulatory Visit (HOSPITAL_COMMUNITY): Payer: Self-pay

## 2021-09-26 DIAGNOSIS — M79604 Pain in right leg: Secondary | ICD-10-CM | POA: Insufficient documentation

## 2021-09-26 DIAGNOSIS — M79671 Pain in right foot: Secondary | ICD-10-CM | POA: Insufficient documentation

## 2021-09-28 ENCOUNTER — Other Ambulatory Visit: Payer: BC Managed Care – PPO

## 2021-09-28 LAB — MANUAL DIFFERENTIAL
BAND %: 1 % — ABNORMAL LOW (ref 5–11)
BANDS NEUTROPHILS MANUAL: 1
EOSINOPHIL %: 3 % (ref 0–7)
EOSINOPHIL ABSOLUTE: 0.36 10*3/uL (ref 0.00–0.80)
EOSINOPHILS MANUAL: 3
LYMPHOCYTE %: 13 % — ABNORMAL LOW (ref 25–45)
LYMPHOCYTE ABSOLUTE: 1.56 10*3/uL (ref 1.10–5.00)
LYMPHOCYTES MANUAL: 13
MONOCYTE %: 5 % (ref 0–12)
MONOCYTE ABSOLUTE: 0.6 10*3/uL (ref 0.00–1.30)
MONOCYTES MANUAL: 5
NEUTROPHIL %: 78 % — ABNORMAL HIGH (ref 40–76)
NEUTROPHIL ABSOLUTE: 9.48 10*3/uL — ABNORMAL HIGH (ref 1.80–8.40)
NEUTROPHILS MANUAL: 78
PLATELET MORPHOLOGY COMMENT: NORMAL
RBC MORPHOLOGY COMMENT: NORMAL
TOTAL CELLS COUNTED [#] IN BLOOD: 100
WBC: 12 10*3/uL

## 2021-09-28 LAB — COMPREHENSIVE METABOLIC PANEL, NON-FASTING
ALBUMIN/GLOBULIN RATIO: 1.2 (ref 0.8–1.4)
ALBUMIN: 3.8 g/dL (ref 3.5–5.7)
ALKALINE PHOSPHATASE: 108 U/L — ABNORMAL HIGH (ref 34–104)
ALT (SGPT): 15 U/L (ref 7–52)
ANION GAP: 8 mmol/L — ABNORMAL LOW (ref 10–20)
AST (SGOT): 11 U/L — ABNORMAL LOW (ref 13–39)
BILIRUBIN TOTAL: 1.1 mg/dL (ref 0.3–1.2)
BUN/CREA RATIO: 13 (ref 6–22)
BUN: 13 mg/dL (ref 7–25)
CALCIUM, CORRECTED: 9.3 mg/dL (ref 8.9–10.8)
CALCIUM: 9.1 mg/dL (ref 8.6–10.3)
CHLORIDE: 100 mmol/L (ref 98–107)
CO2 TOTAL: 26 mmol/L (ref 21–31)
CREATININE: 1 mg/dL (ref 0.60–1.30)
ESTIMATED GFR: 86 mL/min/{1.73_m2} (ref >59)
GLOBULIN: 3.1 (ref 2.9–5.4)
GLUCOSE: 93 mg/dL (ref 74–109)
OSMOLALITY, CALCULATED: 268 mOsm/kg — ABNORMAL LOW (ref 270–290)
POTASSIUM: 4 mmol/L (ref 3.5–5.1)
PROTEIN TOTAL: 6.9 g/dL (ref 6.4–8.9)
SODIUM: 134 mmol/L — ABNORMAL LOW (ref 136–145)

## 2021-09-28 LAB — CBC WITH DIFF
HCT: 34.3 % — ABNORMAL LOW (ref 42.0–51.0)
HGB: 11.9 g/dL — ABNORMAL LOW (ref 13.5–18.0)
MCH: 30 pg (ref 27.0–32.0)
MCHC: 34.6 g/dL (ref 32.0–36.0)
MCV: 86.7 fL (ref 78.0–99.0)
MPV: 7.8 fL (ref 7.4–10.4)
PLATELETS: 460 10*3/uL — ABNORMAL HIGH (ref 140–440)
RBC: 3.96 10*6/uL — ABNORMAL LOW (ref 4.20–6.00)
RDW: 14 % (ref 11.6–14.8)
WBC: 12 10*3/uL — ABNORMAL HIGH (ref 4.0–10.5)
WBCS UNCORRECTED: 12 10*3/uL

## 2021-09-28 LAB — CBC/DIFF - CLIENT CONSOLIDATED
BASOPHIL #: 0.2 10*3/uL (ref 0.00–0.30)
BASOPHIL %: 1 % (ref 0–3)
EOSINOPHIL #: 0.5 10*3/uL (ref 0.00–0.80)
EOSINOPHIL %: 4 % (ref 0–7)
HCT: 34.3 % — ABNORMAL LOW (ref 42.0–51.0)
HGB: 11.9 g/dL — ABNORMAL LOW (ref 13.5–18.0)
LYMPHOCYTE #: 1.5 10*3/uL (ref 1.10–5.00)
LYMPHOCYTE %: 13 % — ABNORMAL LOW (ref 25–45)
MCH: 30 pg (ref 27.0–32.0)
MCHC: 34.6 g/dL (ref 32.0–36.0)
MCV: 86.7 fL (ref 78.0–99.0)
MONOCYTE #: 1.1 10*3/uL (ref 0.00–1.30)
MONOCYTE %: 9 % (ref 0–12)
MPV: 7.8 fL (ref 7.4–10.4)
NEUTROPHIL #: 8.7 10*3/uL — ABNORMAL HIGH (ref 1.80–8.40)
NEUTROPHIL %: 73 % (ref 40–76)
PLATELETS: 460 10*3/uL — ABNORMAL HIGH (ref 140–440)
RBC: 3.96 10*6/uL — ABNORMAL LOW (ref 4.20–6.00)
RDW: 14 % (ref 11.6–14.8)
WBC: 12 10*3/uL
WBC: 12 10*3/uL — ABNORMAL HIGH (ref 4.0–10.5)

## 2021-10-02 ENCOUNTER — Other Ambulatory Visit
Payer: BC Managed Care – PPO | Attending: PHYSICAL MEDICINE AND REHABILITATION | Admitting: PHYSICAL MEDICINE AND REHABILITATION

## 2021-10-02 LAB — CBC WITH DIFF
BASOPHIL #: 0.2 10*3/uL (ref 0.00–0.30)
BASOPHIL %: 3 % (ref 0–3)
EOSINOPHIL #: 0.5 10*3/uL (ref 0.00–0.80)
EOSINOPHIL %: 6 % (ref 0–7)
HCT: 33.8 % — ABNORMAL LOW (ref 42.0–51.0)
HGB: 11.6 g/dL — ABNORMAL LOW (ref 13.5–18.0)
LYMPHOCYTE #: 1.6 10*3/uL (ref 1.10–5.00)
LYMPHOCYTE %: 22 % — ABNORMAL LOW (ref 25–45)
MCH: 29.8 pg (ref 27.0–32.0)
MCHC: 34.2 g/dL (ref 32.0–36.0)
MCV: 87.1 fL (ref 78.0–99.0)
MONOCYTE #: 0.7 10*3/uL (ref 0.00–1.30)
MONOCYTE %: 10 % (ref 0–12)
MPV: 7.8 fL (ref 7.4–10.4)
NEUTROPHIL #: 4.3 10*3/uL (ref 1.80–8.40)
NEUTROPHIL %: 60 % (ref 40–76)
PLATELETS: 431 10*3/uL (ref 140–440)
RBC: 3.88 10*6/uL — ABNORMAL LOW (ref 4.20–6.00)
RDW: 14 % (ref 11.6–14.8)
WBC: 7.3 10*3/uL (ref 4.0–10.5)
WBCS UNCORRECTED: 7.3 10*3/uL

## 2021-10-02 LAB — CBC/DIFF - CLIENT CONSOLIDATED
BASOPHIL #: 0.2 10*3/uL (ref 0.00–0.30)
BASOPHIL %: 3 % (ref 0–3)
EOSINOPHIL #: 0.5 10*3/uL (ref 0.00–0.80)
EOSINOPHIL %: 6 % (ref 0–7)
HCT: 33.8 % — ABNORMAL LOW (ref 42.0–51.0)
HGB: 11.6 g/dL — ABNORMAL LOW (ref 13.5–18.0)
LYMPHOCYTE #: 1.6 10*3/uL (ref 1.10–5.00)
LYMPHOCYTE %: 22 % — ABNORMAL LOW (ref 25–45)
MCH: 29.8 pg (ref 27.0–32.0)
MCHC: 34.2 g/dL (ref 32.0–36.0)
MCV: 87.1 fL (ref 78.0–99.0)
MONOCYTE #: 0.7 10*3/uL (ref 0.00–1.30)
MONOCYTE %: 10 % (ref 0–12)
MPV: 7.8 fL (ref 7.4–10.4)
NEUTROPHIL #: 4.3 10*3/uL (ref 1.80–8.40)
NEUTROPHIL %: 60 % (ref 40–76)
PLATELETS: 431 10*3/uL (ref 140–440)
RBC: 3.88 10*6/uL — ABNORMAL LOW (ref 4.20–6.00)
RDW: 14 % (ref 11.6–14.8)
WBC: 7.3 10*3/uL
WBC: 7.3 10*3/uL (ref 4.0–10.5)

## 2021-10-05 ENCOUNTER — Other Ambulatory Visit (HOSPITAL_COMMUNITY): Payer: Self-pay | Admitting: EXTERNAL

## 2021-10-05 DIAGNOSIS — S82409A Unspecified fracture of shaft of unspecified fibula, initial encounter for closed fracture: Secondary | ICD-10-CM

## 2021-10-05 DIAGNOSIS — S129XXS Fracture of neck, unspecified, sequela: Secondary | ICD-10-CM

## 2021-10-07 ENCOUNTER — Other Ambulatory Visit: Payer: Self-pay

## 2021-10-07 ENCOUNTER — Encounter (HOSPITAL_COMMUNITY): Payer: Self-pay

## 2021-10-07 ENCOUNTER — Ambulatory Visit
Admission: RE | Admit: 2021-10-07 | Discharge: 2021-10-07 | Disposition: A | Discharge status: Still In Hospital | Payer: Worker's Comp, Other unspecified | Source: Ambulatory Visit | Attending: EXTERNAL | Admitting: EXTERNAL

## 2021-10-07 DIAGNOSIS — S82409A Unspecified fracture of shaft of unspecified fibula, initial encounter for closed fracture: Secondary | ICD-10-CM

## 2021-10-07 DIAGNOSIS — S129XXS Fracture of neck, unspecified, sequela: Secondary | ICD-10-CM
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
## 2021-10-08 ENCOUNTER — Other Ambulatory Visit (HOSPITAL_COMMUNITY): Payer: Self-pay

## 2021-10-08 DIAGNOSIS — S82891A Other fracture of right lower leg, initial encounter for closed fracture: Secondary | ICD-10-CM

## 2021-10-14 ENCOUNTER — Ambulatory Visit (HOSPITAL_COMMUNITY): Payer: Self-pay

## 2021-10-14 ENCOUNTER — Encounter (HOSPITAL_COMMUNITY): Payer: Self-pay

## 2021-10-15 ENCOUNTER — Encounter (HOSPITAL_COMMUNITY): Payer: Self-pay

## 2021-10-15 ENCOUNTER — Ambulatory Visit (HOSPITAL_COMMUNITY): Payer: Self-pay

## 2021-10-16 ENCOUNTER — Ambulatory Visit (HOSPITAL_COMMUNITY): Payer: Self-pay

## 2021-10-19 ENCOUNTER — Encounter (HOSPITAL_COMMUNITY): Payer: Self-pay

## 2021-10-19 ENCOUNTER — Emergency Department
Admission: EM | Admit: 2021-10-19 | Discharge: 2021-10-19 | Disposition: A | Discharge status: Home / Self Care | Payer: BC Managed Care – PPO | Attending: Emergency Medicine | Admitting: Emergency Medicine

## 2021-10-19 ENCOUNTER — Other Ambulatory Visit: Payer: Self-pay

## 2021-10-19 DIAGNOSIS — G40909 Epilepsy, unspecified, not intractable, without status epilepticus: Secondary | ICD-10-CM | POA: Insufficient documentation

## 2021-10-19 DIAGNOSIS — Z87898 Personal history of other specified conditions: Secondary | ICD-10-CM

## 2021-10-19 HISTORY — DX: Other fracture of unspecified lower leg, initial encounter for closed fracture: S82.899A

## 2021-10-19 LAB — CBC WITH DIFF
BASOPHIL #: 0 10*3/uL (ref 0.00–0.30)
BASOPHIL %: 0 % (ref 0–3)
EOSINOPHIL #: 0.2 10*3/uL (ref 0.00–0.80)
EOSINOPHIL %: 3 % (ref 0–7)
HCT: 37 % — ABNORMAL LOW (ref 42.0–51.0)
HGB: 12.6 g/dL — ABNORMAL LOW (ref 13.5–18.0)
LYMPHOCYTE #: 1.9 10*3/uL (ref 1.10–5.00)
LYMPHOCYTE %: 30 % (ref 25–45)
MCH: 29.7 pg (ref 27.0–32.0)
MCHC: 34.2 g/dL (ref 32.0–36.0)
MCV: 86.9 fL (ref 78.0–99.0)
MONOCYTE #: 0.6 10*3/uL (ref 0.00–1.30)
MONOCYTE %: 9 % (ref 0–12)
MPV: 7.9 fL (ref 7.4–10.4)
NEUTROPHIL #: 3.6 10*3/uL (ref 1.80–8.40)
NEUTROPHIL %: 58 % (ref 40–76)
PLATELETS: 282 10*3/uL (ref 140–440)
RBC: 4.26 10*6/uL (ref 4.20–6.00)
RDW: 14.7 % (ref 11.6–14.8)
WBC: 6.2 10*3/uL (ref 4.0–10.5)
WBCS UNCORRECTED: 6.2 10*3/uL

## 2021-10-19 LAB — BASIC METABOLIC PANEL
ANION GAP: 9 mmol/L — ABNORMAL LOW (ref 10–20)
BUN/CREA RATIO: 16 (ref 6–22)
BUN: 13 mg/dL (ref 7–25)
CALCIUM: 9.3 mg/dL (ref 8.6–10.3)
CHLORIDE: 109 mmol/L — ABNORMAL HIGH (ref 98–107)
CO2 TOTAL: 21 mmol/L (ref 21–31)
CREATININE: 0.83 mg/dL (ref 0.60–1.30)
ESTIMATED GFR: 100 mL/min/{1.73_m2} (ref >59)
GLUCOSE: 104 mg/dL (ref 74–109)
OSMOLALITY, CALCULATED: 278 mOsm/kg (ref 270–290)
POTASSIUM: 3.4 mmol/L — ABNORMAL LOW (ref 3.5–5.1)
SODIUM: 139 mmol/L (ref 136–145)

## 2021-10-19 LAB — BLUE TOP TUBE

## 2021-10-19 LAB — LIGHT GREEN TOP TUBE

## 2021-10-19 LAB — GRAY TOP TUBE

## 2021-10-19 NOTE — ED Triage Notes (Signed)
WAS WORKING OUTSIDE WHEN PT BECAME HOT RESULTING IN SEIZURE WITH HX . ACC 111

## 2021-10-19 NOTE — ED Provider Notes (Signed)
[facility]  ED Primary Provider Note  Patient Name: Steven Mendez  Patient Age: 61 y.o.  Date of Birth: 11/26/1960    Chief Complaint: Seizure Prior Hx Of        History of Present Illness       Steven Mendez is a 61 y.o. male who had concerns including Seizure Prior Hx Of.  This patient is a 61 year old male who presents with a seizure which self-resolved at home.  The patient has history of seizures, and is on antiseizure medication.  He states he felt the seizure beginning, and after experiencing the prodrome, he sat himself down, and suffered absolutely no injury.  He was then brought to the emergency department for evaluation.        Review of Systems     No other overt Review of Systems are noted to be positive except noted in the HPI.      Historical Data   History Reviewed This Encounter:        Physical Exam   ED Triage Vitals [10/19/21 1540]   BP (Non-Invasive) (!) 147/89   Heart Rate 90   Respiratory Rate 20   Temperature 36.7 C (98.1 F)   SpO2 99 %   Weight 102 kg (224 lb)   Height 1.798 m (5' 10.8")         Nursing notes reviewed for what could be assessed. Past Medical, Surgical, and Social history reviewed. Exam limited in the setting of personal protective equipment.    Constitutional: NAD. Well-Developed. Well Nourished.  Head: Normocephalic, atraumatic.  Mouth/Throat:  Symmetric facial movement, no slurred speech.  Eyes: EOM grossly intact, conjunctiva normal.  Pupils appear equal.  Neck: Supple  Cardiovascular: Extremities well perfused.  Pulmonary/Chest: No respiratory distress.   Abdominal: Non-distended. No overt peritoneal findings.   MSK:  Patient has tenderness to the right ankle (history of injury)  Skin: Warm, dry, and intact for what is visualized.   Neuro: Appropriate, CN II-XII grossly intact.  Psych: Pleasant          Procedures      Patient Data     Labs Ordered/Reviewed   BASIC METABOLIC PANEL - Abnormal; Notable for the following  components:       Result Value    POTASSIUM 3.4 (*)     CHLORIDE 109 (*)     ANION GAP 9 (*)     All other components within normal limits    Narrative:     Estimated Glomerular Filtration Rate (eGFR) is calculated using the CKD-EPI (2021) equation, intended for patients 41 years of age and older. If gender is not documented or "unknown", there will be no eGFR calculation.   CBC WITH DIFF - Abnormal; Notable for the following components:    HGB 12.6 (*)     HCT 37.0 (*)     All other components within normal limits   CBC/DIFF    Narrative:     The following orders were created for panel order CBC/DIFF.  Procedure                               Abnormality         Status                     ---------                               -----------         ------  CBC WITH HQRF[[id number]]                Abnormal            Final result                 Please view results for these tests on the individual orders.       No orders to display       Medical Decision Making          MDM      Studies Assessed: Lab      MDM Narrative:  This patient is a 61 year old male who presents with a resolved seizure.  Does have history of seizure disorder, and is compliant with medication.  He has not formally seen a neurologist.  Differential includes epilepsy, metabolic abnormality, hypoglycemia.  Patient underwent screening labs with no acute abnormality.  Considering patient has had seizures previously, and he reports being out in the sun for which he was supposed to avoid, patient agreed to screening labs, without more advanced workup considering there does not appear to be any focal neurological deficit.  Referral was placed for formal neurology evaluation considering he has not yet seen a neurologist in regards to a seizure disorder which has been primarily managed by the primary care provider.  Close return precautions given, seizure precautions, and information on how to reference [place] law in regards to  motor vehicle operation were all discussed.                  Following the history, physical exam, and ED workup, the patient was deemed stable and suitable for discharge. The patient/caregiver was advised to return to the ED for any new or worsening symptoms. Discharge medications, and follow-up instructions were discussed with the patient/caregiver in detail, who verbalizes understanding. The patient/caregiver is in agreement and is comfortable with the plan of care.    Disposition: Discharged         Current Discharge Medication List      CONTINUE these medications - NO CHANGES were made during your visit.      Details   ergocalciferol (vitamin D2) 1,250 mcg (50,000 unit) Capsule  Commonly known as: DRISDOL   50,000 Units, Oral, EVERY 7 DAYS  Refills: 0     esomeprazole magnesium 40 mg Capsule, Delayed Release(E.C.)  Commonly known as: NEXIUM   40 mg, Oral, EVERY MORNING BEFORE BREAKFAST  Refills: 0     levETIRAcetam 500 mg Tablet  Commonly known as: KEPPRA   500 mg, Oral, 2 TIMES DAILY  Refills: 0     lisinopriL 20 mg Tablet  Commonly known as: PRINIVIL   20 mg, Oral, DAILY  Refills: 0     naproxen 500 mg Tablet  Commonly known as: NAPROSYN   500 mg, Oral, 2 TIMES DAILY WITH FOOD  Refills: 0     ondansetron 4 mg Tablet, Rapid Dissolve  Commonly known as: ZOFRAN ODT   4 mg, Oral, EVERY 8 HOURS PRN  Refills: 0     rOPINIRole 4 mg Tablet  Commonly known as: REQUIP   4 mg, Oral, NIGHTLY  Refills: 0     sucralfate 1 gram Tablet  Commonly known as: CARAFATE   1 g, Oral, EVERY 6 HOURS (SCHEDULED)  Refills: 0        ASK your doctor about these medications.      Details   gabapentin 800 mg Tablet  Commonly known as: NEURONTIN  Ask  about: Which instructions should I use?   800 mg, Oral, 3 TIMES DAILY  Refills: 0          Follow up:   Neurology, [place] [id number]  [phone]                   Clinical Impression   History of seizure (Primary)         Discharge  Medication List as of 10/19/2021  5:30 PM            R. Baldo Daub, MD, [facility]  Department of Emergency Medicine

## 2021-10-19 NOTE — ED Nurses Note (Signed)
Patient discharged home with family.  AVS reviewed with patient/care giver.  A written copy of the AVS and discharge instructions was given to the patient/care giver.  Questions sufficiently answered as needed.  Patient/care giver encouraged to follow up with PCP as indicated.  In the event of an emergency, patient/care giver instructed to call 911 or go to the nearest emergency room.

## 2021-10-19 NOTE — Discharge Instructions (Signed)
Thank you for allowing [place] to be part of your care.    We discussed screening labs today, for which you were comfortable not proceeding with a prolonged evaluation considering your previous history of seizures.  Please follow-up with neurology to manage her seizures.    Per [facility] state law, a patient is prohibited to drive or operate heavy machinery for 6 months (please confirm to ensure no change in law has occured) after any unexplained episode of loss of consciousness/seziure. Importance of safety precautions to avoid includes: no free rock climbing, no swimming unsupervised, take showers instead of baths and/or avoidance of any other high risk behaviors (swimming or being around large amounts of water). Seizure medications can worsen depression, please seek immediate medical attention if suicidal ideation or significant mood changes are noted.      Please discuss all medications with your pharmacist to ensure there are no concerns of interactions.    Please ensure all questions or concerns are addressed prior to leaving the hospital. We want to make sure your concerns are addressed to make sure you are as safe and healthy as possible. By leaving the hospital, it is understood you are in agreement with your treatment plan.    You may have received sedating medication during your visit. Please discuss this with your discharging provider nurse as you may not be able to operate machines while the medication is in your system, or while you are taking any potentially sedating prescriptions.    Please call the hospital medical records office for a copy of your finalized results, and review them with a primary care physician, for any findings needing further attention.    If you feel your situation worsens, or does not get better in 48 hours, please see a physician for evaluation.    We encourage you to see your regular doctor as soon as possible to let them know you were seen in the emergency department. They may want to  do further testing. If you do not have a doctor, please feel free to call the hospital, and ask for contact information of accepting providers. Please also discuss your vaccinations, and ensure all are up to date.    You may use this document to take today off work or school.

## 2021-10-21 ENCOUNTER — Ambulatory Visit (HOSPITAL_COMMUNITY): Payer: Self-pay

## 2021-10-26 LAB — POC BLOOD GLUCOSE (RESULTS): GLUCOSE, POC: 110 mg/dl (ref 50–500)

## 2021-12-20 ENCOUNTER — Encounter (HOSPITAL_COMMUNITY): Payer: Self-pay

## 2021-12-20 ENCOUNTER — Emergency Department (HOSPITAL_COMMUNITY): Payer: Worker's Comp, Other unspecified

## 2021-12-20 ENCOUNTER — Other Ambulatory Visit: Payer: Self-pay

## 2021-12-20 ENCOUNTER — Emergency Department
Admission: EM | Admit: 2021-12-20 | Discharge: 2021-12-20 | Disposition: A | Discharge status: Home / Self Care | Payer: Worker's Comp, Other unspecified | Attending: Emergency Medicine | Admitting: Emergency Medicine

## 2021-12-20 DIAGNOSIS — N281 Cyst of kidney, acquired: Secondary | ICD-10-CM | POA: Insufficient documentation

## 2021-12-20 DIAGNOSIS — M47816 Spondylosis without myelopathy or radiculopathy, lumbar region: Secondary | ICD-10-CM | POA: Insufficient documentation

## 2021-12-20 DIAGNOSIS — R569 Unspecified convulsions: Secondary | ICD-10-CM | POA: Insufficient documentation

## 2021-12-20 DIAGNOSIS — M4856XA Collapsed vertebra, not elsewhere classified, lumbar region, initial encounter for fracture: Secondary | ICD-10-CM | POA: Insufficient documentation

## 2021-12-20 DIAGNOSIS — M5134 Other intervertebral disc degeneration, thoracic region: Secondary | ICD-10-CM | POA: Insufficient documentation

## 2021-12-20 DIAGNOSIS — M549 Dorsalgia, unspecified: Secondary | ICD-10-CM | POA: Insufficient documentation

## 2021-12-20 DIAGNOSIS — I2510 Atherosclerotic heart disease of native coronary artery without angina pectoris: Secondary | ICD-10-CM

## 2021-12-20 LAB — CBC WITH DIFF
BASOPHIL #: 0 10*3/uL (ref 0.00–0.30)
BASOPHIL %: 1 % (ref 0–3)
EOSINOPHIL #: 0.3 10*3/uL (ref 0.00–0.80)
EOSINOPHIL %: 3 % (ref 0–7)
HCT: 41 % — ABNORMAL LOW (ref 42.0–51.0)
HGB: 13.8 g/dL (ref 13.5–18.0)
LYMPHOCYTE #: 2.8 10*3/uL (ref 1.10–5.00)
LYMPHOCYTE %: 27 % (ref 25–45)
MCH: 29.5 pg (ref 27.0–32.0)
MCHC: 33.7 g/dL (ref 32.0–36.0)
MCV: 87.8 fL (ref 78.0–99.0)
MONOCYTE #: 0.6 10*3/uL (ref 0.00–1.30)
MONOCYTE %: 6 % (ref 0–12)
MPV: 7.5 fL (ref 7.4–10.4)
NEUTROPHIL #: 6.6 10*3/uL (ref 1.80–8.40)
NEUTROPHIL %: 64 % (ref 40–76)
PLATELETS: 318 10*3/uL (ref 140–440)
RBC: 4.67 10*6/uL (ref 4.20–6.00)
RDW: 14.9 % — ABNORMAL HIGH (ref 11.6–14.8)
WBC: 10.3 10*3/uL (ref 4.0–10.5)
WBCS UNCORRECTED: 10.3 10*3/uL

## 2021-12-20 LAB — COMPREHENSIVE METABOLIC PANEL, NON-FASTING
ALBUMIN/GLOBULIN RATIO: 2 — ABNORMAL HIGH (ref 0.8–1.4)
ALBUMIN: 4.1 g/dL (ref 3.5–5.7)
ALKALINE PHOSPHATASE: 117 U/L — ABNORMAL HIGH (ref 34–104)
ALT (SGPT): 17 U/L (ref 7–52)
ANION GAP: 8 mmol/L — ABNORMAL LOW (ref 10–20)
AST (SGOT): 15 U/L (ref 13–39)
BILIRUBIN TOTAL: 0.5 mg/dL (ref 0.3–1.2)
BUN/CREA RATIO: 22 (ref 6–22)
BUN: 17 mg/dL (ref 7–25)
CALCIUM, CORRECTED: 9.5 mg/dL (ref 8.9–10.8)
CALCIUM: 9.6 mg/dL (ref 8.6–10.3)
CHLORIDE: 107 mmol/L (ref 98–107)
CO2 TOTAL: 25 mmol/L (ref 21–31)
CREATININE: 0.77 mg/dL (ref 0.60–1.30)
ESTIMATED GFR: 102 mL/min/{1.73_m2} (ref >59)
GLOBULIN: 2.1 — ABNORMAL LOW (ref 2.9–5.4)
GLUCOSE: 113 mg/dL — ABNORMAL HIGH (ref 74–109)
OSMOLALITY, CALCULATED: 282 mOsm/kg (ref 270–290)
POTASSIUM: 4.1 mmol/L (ref 3.5–5.1)
PROTEIN TOTAL: 6.2 g/dL — ABNORMAL LOW (ref 6.4–8.9)
SODIUM: 140 mmol/L (ref 136–145)

## 2021-12-20 LAB — ETHANOL, SERUM: ETHANOL: <10 mg/dL — ABNORMAL HIGH

## 2021-12-20 LAB — URINE DRUG SCREEN
AMPHET QL: NEGATIVE
BARB QL: NEGATIVE
BENZO QL: NEGATIVE
BUP QL: NEGATIVE
CANNAQL: NEGATIVE
COCQL: NEGATIVE
METHQL: NEGATIVE
OPIATE: POSITIVE — AB
OXYCODONE URINE: NEGATIVE
PCP QL: NEGATIVE
TRICYCLIC ANTIDEPRESSANTS URINE: NEGATIVE

## 2021-12-20 LAB — MAGNESIUM: MAGNESIUM: 2 mg/dL (ref 1.9–2.7)

## 2021-12-20 LAB — URINALYSIS, MICROSCOPIC
RBCS: <1 /hpf (ref <4)
WBCS: <1 /hpf (ref <6)

## 2021-12-20 LAB — URINALYSIS, MACROSCOPIC
BILIRUBIN: NEGATIVE mg/dL
BLOOD: NEGATIVE mg/dL
GLUCOSE: NEGATIVE mg/dL
KETONES: NEGATIVE mg/dL
LEUKOCYTES: NEGATIVE WBCs/uL
NITRITE: NEGATIVE
PH: 6.5 (ref 5.0–9.0)
PROTEIN: NEGATIVE mg/dL
SPECIFIC GRAVITY: 1.013 (ref 1.002–1.030)
UROBILINOGEN: NORMAL mg/dL

## 2021-12-20 LAB — AMMONIA: AMMONIA: 39 umol/L (ref 16–53)

## 2021-12-20 LAB — TROPONIN-I: TROPONIN I: 3 ng/L (ref <20)

## 2021-12-20 MED ORDER — LEVETIRACETAM 500 MG/100 ML IN SODIUM CHLORIDE (ISO-OSM) IV PIGGYBACK
500.0000 mg | INJECTION | Freq: Two times a day (BID) | INTRAVENOUS | Status: DC
Start: 2021-12-21 — End: 2021-12-21
  Administered 2021-12-20: 0 mg via INTRAVENOUS
  Administered 2021-12-20: 500 mg via INTRAVENOUS

## 2021-12-20 MED ORDER — CYCLOBENZAPRINE 10 MG TABLET
ORAL_TABLET | ORAL | Status: AC
Start: 2021-12-20 — End: 2021-12-20
  Filled 2021-12-20: qty 1

## 2021-12-20 MED ORDER — CYCLOBENZAPRINE 10 MG TABLET
10.0000 mg | ORAL_TABLET | ORAL | Status: AC
Start: 2021-12-20 — End: 2021-12-20
  Administered 2021-12-20: 10 mg via ORAL

## 2021-12-20 MED ORDER — LEVETIRACETAM 500 MG/100 ML IN SODIUM CHLORIDE (ISO-OSM) IV PIGGYBACK
INJECTION | INTRAVENOUS | Status: AC
Start: 2021-12-20 — End: 2021-12-20
  Filled 2021-12-20: qty 100
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
## 2021-12-20 NOTE — Discharge Instructions (Signed)
Please take your Keppra and stop driving. Follow up with your neurologist and spine surgeon.

## 2021-12-20 NOTE — ED Nurses Note (Signed)
Pt d/c home at this time. IV removed, catheter intact, pressure dressing applied. Pt verbalized understanding of d/c instructions and ambulated out of the department.
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
## 2021-12-21 LAB — ECG 12 LEAD
Atrial Rate: 83 {beats}/min
Calculated P Axis: 47 degrees
Calculated R Axis: 41 degrees
Calculated T Axis: 64 degrees
PR Interval: 176 ms
QRS Duration: 88 ms
QT Interval: 354 ms
QTC Calculation: 415 ms
Ventricular rate: 83 {beats}/min

## 2021-12-24 ENCOUNTER — Other Ambulatory Visit: Payer: Self-pay

## 2021-12-24 ENCOUNTER — Ambulatory Visit (HOSPITAL_COMMUNITY)
Admission: RE | Admit: 2021-12-24 | Discharge: 2021-12-24 | Disposition: A | Discharge status: Still In Hospital | Payer: Worker's Comp, Other unspecified | Source: Ambulatory Visit

## 2021-12-24 DIAGNOSIS — S93439D Sprain of tibiofibular ligament of unspecified ankle, subsequent encounter: Secondary | ICD-10-CM | POA: Insufficient documentation

## 2021-12-24 DIAGNOSIS — S82401D Unspecified fracture of shaft of right fibula, subsequent encounter for closed fracture with routine healing: Secondary | ICD-10-CM
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
## 2021-12-25 ENCOUNTER — Other Ambulatory Visit (HOSPITAL_COMMUNITY): Payer: Self-pay

## 2021-12-25 DIAGNOSIS — S82401D Unspecified fracture of shaft of right fibula, subsequent encounter for closed fracture with routine healing: Secondary | ICD-10-CM

## 2021-12-25 DIAGNOSIS — S93439D Sprain of tibiofibular ligament of unspecified ankle, subsequent encounter: Secondary | ICD-10-CM

## 2021-12-28 ENCOUNTER — Ambulatory Visit (HOSPITAL_COMMUNITY)
Admission: RE | Admit: 2021-12-28 | Discharge: 2021-12-28 | Disposition: A | Discharge status: Still In Hospital | Payer: Worker's Comp, Other unspecified | Source: Ambulatory Visit | Attending: Neurological Surgery | Admitting: Neurological Surgery

## 2021-12-28 ENCOUNTER — Ambulatory Visit (HOSPITAL_COMMUNITY): Payer: Self-pay

## 2021-12-28 ENCOUNTER — Other Ambulatory Visit: Payer: Self-pay
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
## 2021-12-30 ENCOUNTER — Ambulatory Visit
Admission: RE | Admit: 2021-12-30 | Discharge: 2021-12-30 | Disposition: A | Discharge status: Still In Hospital | Payer: Worker's Comp, Other unspecified | Source: Ambulatory Visit | Attending: Neurological Surgery | Admitting: Neurological Surgery

## 2021-12-30 ENCOUNTER — Other Ambulatory Visit: Payer: Self-pay

## 2021-12-30 NOTE — PT Treatment (Signed)
[facility] Medicine [facility]  Outpatient Physical Therapy  [address]  [place], [address]  [phone]  (Fax) [phone]    Physical Therapy Treatment Note    Date: 12/30/2021  Patient's Name: Steven Mendez  Date of Birth: [birth year]      Visit #/POC: 2/12  Authorization: Authorized 6 visits   POC Signed?: No   POC Ends: 02/08/22  Next Progress Note Due: By 10th visit or 9/14    Evaluating Physical Therapist: Louretta Parma, DPT   PT diagnosis/Reason for Referral: Low back pain s/p T11-L3 posterior spinal fusion with pedicles screws and fixator rods and L1 Laminectomy    Next Scheduled Physician Appointment: 12/31/21  Allergies/Contraindications: None       Subjective: No significant change in symptoms, 7/10 in low back pain, reports tingling down right LE.     Objective: There Ex as followed below      EXERCISE/ACTIVITY NAME REPETITIONS RESISTANCE COMPLETED THIS DOS   Nustep    6 minutes 1 Y   BKTC phsioball    x15  Y   LTR Physioball    x15  Y   Bent knee fall out core    2x10  Y   Supine Marches- Core activaiton    2x10  Y   ESTIM    X10 minutes running time IFC sweep on   80-150Hz   11.0 Volts  Y   Seated left rotation physioball   x15  Y   Seated left side bending physioball    x15  Y           Assessment: Patient demonstrated poor activity tolerance due to significant increase in low back pain and radicular symptoms down right leg. After attempting TE with increased pain, was going to attempt gentle MFR of the lumbar paraspinals; however significant tenderness was noted and unable to tolerate. He also had poor tolerance to positional changes from supine to sidelying. Went to [employer] for pain management. Afterwards reported reduced pain and was able to tolerate increased activity, adding seated left rotation and left side bending to focus on opening up the right side to see if this reduces radicular symptoms. This went well and felt comfortable for patient.     Short Term Goals: 3  Weeks              -Intermittent vs. constant pain. Worst SPS rating less than 6/10.              -Reduce radicular symptoms  -Patient will tolerate sitting / standing with even weight distribution without increasing back pain.              -NWB spinal mobility and strength WFL to allow pain-free bed mobility.              -Report improvement in functional activities at home and in community with PSFS 7/10 or greater.     Long Term Goals: 6 Weeks              -Abolish radicular symptoms. Worst SPS rating less than 3/10.              -Sleep not disrupted by back pan.              -ROM / Strength WFL to permit normal body mechanics with all mobility.  -ROM / Strength WFL to permit work / Copywriter, advertising / household  / recreational ADL's without Compensatory mechanics due to pain or weakness.              -  Community ambulation not limited by back pain.              -Improve PSFS to 9/10 or greater.              -Will be able to demonstrate proper lifting form and body mechanics with minimal increase in pain, lifting 10# or greater.     Plan: Continue to focus on hip and core stability along with gentle lumbar mobility May continue with ESTIM if needed.     Total Session Time 45 and Timed code minutes 45  THERAPEUTIC EXERCISE 30 minutes and ELECTRICAL STIMULATION 15 minutes      Parks Neptune, PT  12/30/2021, 14:49

## 2022-01-04 ENCOUNTER — Ambulatory Visit (HOSPITAL_COMMUNITY): Payer: Self-pay

## 2022-01-06 ENCOUNTER — Other Ambulatory Visit: Payer: Self-pay

## 2022-01-06 ENCOUNTER — Ambulatory Visit (HOSPITAL_COMMUNITY)
Admission: RE | Admit: 2022-01-06 | Discharge: 2022-01-06 | Disposition: A | Discharge status: Still In Hospital | Payer: Worker's Comp, Other unspecified | Source: Ambulatory Visit | Attending: Neurological Surgery | Admitting: Neurological Surgery

## 2022-01-06 DIAGNOSIS — M545 Low back pain, unspecified: Secondary | ICD-10-CM | POA: Insufficient documentation

## 2022-01-06 NOTE — PT Treatment (Signed)
[facility] Medicine [facility]  Outpatient Physical Therapy  [address]  [place], [address]  [phone]  (Fax) [phone]    Physical Therapy Treatment Note    Date: 01/06/2022  Patient's Name: Steven Mendez  Date of Birth: 1960-05-24    Visit #/POC: 3/12  Authorization: Authorized 6 visits   POC Signed?: No   POC Ends: 02/08/22  Next Progress Note Due: By 10th visit or 9/14     Evaluating Physical Therapist: Louretta Parma, DPT   PT diagnosis/Reason for Referral: Low back pain s/p T11-L3 posterior spinal fusion with pedicles screws and fixator rods and L1 Laminectomy    Next Scheduled Physician Appointment: 12/31/21  Allergies/Contraindications: None         Subjective: Reports 5/10 pain in back at start of session. Continues to endorse radicular symptoms down right LE. Was feeling a little better after last session, however nothing significant at this time.      Objective: There Ex as followed below        EXERCISE/ACTIVITY NAME REPETITIONS RESISTANCE COMPLETED THIS DOS   Nustep     6 minutes 1 n   BKTC phsioball     x15   Y HEP   LTR Physioball     x15   Y- HEP   Bent knee fall out core     2x10   Y   Supine Marches- Core activaiton     2x10   Y   ESTIM     X10 minutes running time IFC sweep on   80-150Hz   12-15.5 Volts  Y   Seated left rotation physioball    x15   N   Seated left side bending physioball     x15   N   Sciatic nerve flossing supine   2x10   Y HEP    Piriformis Right supine stretch  x10  Y HEP      Assessment:  Tolerated session better today, with reduced pain noted after ESTIM treatment. Continues to have difficulty rolling and supine<>sit mobility due to increased pain. During exercises today, minimal to no increase in symptoms were provoked, although stayed at a constant moderate pain as we kept stretches and hip/core stability exercises light to start with. Provided verbal cues for proper core activation and positioning of the legs for bridges as well as improving  control with bent knee fall outs. Will continue to benefit from skilled PT services.     Short Term Goals: 3 Weeks              -Intermittent vs. constant pain. Worst SPS rating less than 6/10.              -Reduce radicular symptoms  -Patient will tolerate sitting / standing with even weight distribution without increasing back pain.              -NWB spinal mobility and strength WFL to allow pain-free bed mobility.              -Report improvement in functional activities at home and in community with PSFS 7/10 or greater.      Long Term Goals: 6 Weeks              -Abolish radicular symptoms. Worst SPS rating less than 3/10.              -Sleep not disrupted by back pan.              -ROM /  Strength WFL to permit normal body mechanics with all mobility.  -ROM / Strength WFL to permit work / Copywriter, advertising / household  / recreational ADL's without Compensatory mechanics due to pain or weakness.              -Community ambulation not limited by back pain.              -Improve PSFS to 9/10 or greater.              -Will be able to demonstrate proper lifting form and body mechanics with minimal increase in pain, lifting 10# or greater.      Plan: Continue to focus on hip and core stability along with gentle lumbar mobility May continue with ESTIM if needed.     Total Session Time 45 and Timed code minutes 45  THERAPEUTIC EXERCISE 35 minutes and ELECTRICAL STIMULATION 10 minutes      Parks Neptune, PT  01/06/2022, 10:24

## 2022-01-08 ENCOUNTER — Ambulatory Visit (HOSPITAL_COMMUNITY)
Admission: RE | Admit: 2022-01-08 | Discharge: 2022-01-08 | Disposition: A | Discharge status: Still In Hospital | Payer: Worker's Comp, Other unspecified | Source: Ambulatory Visit | Attending: Neurological Surgery | Admitting: Neurological Surgery

## 2022-01-08 ENCOUNTER — Other Ambulatory Visit: Payer: Self-pay

## 2022-01-08 NOTE — PT Treatment (Signed)
[facility] Medicine [facility]  Outpatient Physical Therapy  [address]  [place], [address]  [phone]  (Fax) [phone]    Physical Therapy Treatment Note    Date: 01/08/2022  Patient's Name: Steven Mendez  Date of Birth: 01/05/1961    Visit #/POC: 4/12  Authorization: Authorized 6 visits   POC Signed?: No   POC Ends: 02/08/22  Next Progress Note Due: By 10th visit or 9/14     Evaluating Physical Therapist: Louretta Parma, DPT   PT diagnosis/Reason for Referral: Low back pain s/p T11-L3 posterior spinal fusion with pedicles screws and fixator rods and L1 Laminectomy    Next Scheduled Physician Appointment: 12/31/21  Allergies/Contraindications: None         Subjective: Reports 3/10 pain in back at start of session. States he was a little sore the night after last session. Continues report no change in radicular symptoms.      Objective: There Ex as followed below        EXERCISE/ACTIVITY NAME REPETITIONS RESISTANCE COMPLETED THIS DOS   Nustep     6 minutes 1 n   BKTC phsioball     x15   Y HEP   LTR Physioball     x15   Y- HEP   Bent knee fall out core     2x10   N   Supine Marches- Core activaiton     2x10   N   ESTIM     X10 minutes running time IFC sweep on   80-150Hz   12-15.5 Volts  Y   Seated left rotation physioball    x15   N   Seated left side bending physioball     x15   N   Sciatic nerve flossing supine   2x10   Y HEP    Piriformis Right supine stretch  x10   Y HEP   Hooklying clamshells  2x10  Y HEP 8/25   Bridges  x10  Y HEP     Assessment:  Patient does present with reduced tension and spasms of the lumbar thoracic paraspinals compared to initial evaluation and is able to tolerate piriformis and hamstring stretches with no discomfort today. He has most pain with rolling and supine<>sit this session as well as point tenderness along the lumbar and thoracic spine with unilateral PA. Patient also had significant marked tenderness along the sacrum and coccyx region today.  Focused several minutes on unilateral PA grade 1 for pain management and to improve mobility of the lumbar and lower thoracic spine. Increased tolerance towards end of session with manual therapy, however still moderate tenderness noted.      Short Term Goals: 3 Weeks              -Intermittent vs. constant pain. Worst SPS rating less than 6/10.              -Reduce radicular symptoms  -Patient will tolerate sitting / standing with even weight distribution without increasing back pain.              -NWB spinal mobility and strength WFL to allow pain-free bed mobility.              -Report improvement in functional activities at home and in community with PSFS 7/10 or greater.      Long Term Goals: 6 Weeks              -Abolish radicular symptoms. Worst SPS rating less than 3/10.              -  Sleep not disrupted by back pan.              -ROM / Strength WFL to permit normal body mechanics with all mobility.  -ROM / Strength WFL to permit work / Copywriter, advertising / household  / recreational ADL's without Compensatory mechanics due to pain or weakness.              -Community ambulation not limited by back pain.              -Improve PSFS to 9/10 or greater.              -Will be able to demonstrate proper lifting form and body mechanics with minimal increase in pain, lifting 10# or greater.      Plan: Assess sacral region further next session along with ongoing core and hip stability activities     Total Session Time 45 and Timed code minutes 45  THERAPEUTIC EXERCISE 20 minutes, JOINT MOBILIZATION/MFR 15 minutes, and ELECTRICAL STIMULATION 10 minutes      Parks Neptune, PT  01/08/2022, 09:40

## 2022-01-13 ENCOUNTER — Ambulatory Visit (HOSPITAL_COMMUNITY): Payer: Self-pay

## 2022-01-14 ENCOUNTER — Ambulatory Visit (HOSPITAL_COMMUNITY): Payer: Self-pay

## 2022-01-15 ENCOUNTER — Ambulatory Visit (HOSPITAL_COMMUNITY): Payer: Self-pay

## 2022-01-20 ENCOUNTER — Ambulatory Visit (HOSPITAL_COMMUNITY): Payer: Self-pay

## 2022-01-22 ENCOUNTER — Other Ambulatory Visit: Payer: Self-pay

## 2022-01-22 ENCOUNTER — Ambulatory Visit (HOSPITAL_COMMUNITY): Payer: Self-pay

## 2022-01-22 ENCOUNTER — Ambulatory Visit
Admission: RE | Admit: 2022-01-22 | Discharge: 2022-01-22 | Disposition: A | Discharge status: Still In Hospital | Payer: Worker's Comp, Other unspecified | Source: Ambulatory Visit | Attending: Neurological Surgery | Admitting: Neurological Surgery

## 2022-01-22 NOTE — PT Treatment (Addendum)
[facility] Medicine [facility]  Outpatient Physical Therapy  [address]  [place], [address]  [phone]  (Fax) [phone]    Physical Therapy Treatment Note    Date: 01/22/2022  Patient's Name: Steven Mendez  Date of Birth: Jan 23, 1961    Addendum: DISCHARGE SUMMARY- Patient did not return to any follow up appointments. Patient utilized 5/6 visits approved by insurance. He was not making any significant improvements with therapy over this episode of care, which was spaced out due to patient having COVID and missed nearly 2 weeks of therapy. Will discharge at this time.   Parks Neptune, PT  02/16/2022 08:23      Visit #/POC: 5/12  Authorization: Authorized 6 visits   POC Signed?: No   POC Ends: 02/08/22  Next Progress Note Due: By 10th visit or 9/14     Evaluating Physical Therapist: Louretta Parma, DPT   PT diagnosis/Reason for Referral: Low back pain s/p T11-L3 posterior spinal fusion with pedicles screws and fixator rods and L1 Laminectomy    Next Scheduled Physician Appointment: 12/31/21  Allergies/Contraindications: None         Subjective: Patient had to cancel last few sessions due to COVID. Patient is over COVID now. Continues to report 4/10 pain in low back with symptoms going down right leg, however pain is spreading to the left side of the lumbar region. No radicular symptoms down left leg.      Objective: There Ex as followed below        EXERCISE/ACTIVITY NAME REPETITIONS RESISTANCE COMPLETED THIS DOS   Nustep     6 minutes 1 Y   BKTC phsioball     x15   Y HEP   LTR Physioball     x15  sporting tube  Y- HEP   Bent knee fall out core     2x10   y   Supine Marches- Core activaiton     2x10   y   ESTIM     X10 minutes running time IFC sweep on   80-150Hz   12-15.5 Volts  N   Seated B rotation physioball    x15   y   Seated B side bending physioball     x15   Y   Sciatic nerve flossing supine   2x10   Y HEP    Piriformis Right supine stretch  x10   Y HEP   Hooklying clamshells   2x10   Y HEP 8/25   Bridges bilateral  Unilateral bridges  X10  X10 each   Y HEP   Seated pallofs  x10 Sporting tube  Y       Assessment:  Was able to tolerate gentle lumbar mobility with seated trunk rotation, extension, and lateral bending without significant increase in pain. Continued to focus on hip and core stability exercises with some discomfort occasionally, however was tolerable during session. He continues to report no change in symptoms at this time. Will have one more session approved, but may need to look at returning back to physician for further assessment.      Short Term Goals: 3 Weeks              -Intermittent vs. constant pain. Worst SPS rating less than 6/10. UNMET              -Reduce radicular symptoms UNMET  -Patient will tolerate sitting / standing with even weight distribution without increasing back pain. UNMET              -  NWB spinal mobility and strength WFL to allow pain-free bed mobility. UNMET              -Report improvement in functional activities at home and in community with PSFS 7/10 or greater. UNMET      Long Term Goals: 6 Weeks              -Abolish radicular symptoms. Worst SPS rating less than 3/10. UNMET              -Sleep not disrupted by back pan. UNMET              -ROM / Strength WFL to permit normal body mechanics with all mobility. UNMET  -ROM / Strength WFL to permit work / Copywriter, advertising / household  / recreational ADL's without Compensatory mechanics due to pain or weakness. UNMET              -Community ambulation not limited by back pain. UNMET              -Improve PSFS to 9/10 or greater. UNMET              -Will be able to demonstrate proper lifting form and body mechanics with minimal increase in pain, lifting 10# or greater. UNMET     Plan: Will continue to focus on progressing with lumbar mobility and strengthening of the core and trunk.     Total Session Time 38 and Timed code minutes 38  THERAPEUTIC EXERCISE 38 minutes      [employer], PT  01/22/2022,  11:06

## 2022-02-17 IMAGING — MR MRI LUMBAR SPINE WITHOUT CONTRAST
5 of 6 series · 33 of 48 positions shown · non-contrast
Comparison: None available.

﻿EXAM:  [id number]   MRI LUMBAR SPINE WITHOUT CONTRAST
INDICATION: Low back pain with bilateral hip and right leg pain.
TECHNIQUE: Noncontrast multiplanar, multisequence MRI was performed.

[Series 5: T2 · sagittal · 4.0mm · 1.01mm/px · 5 of 13 slices shown (1 of 3)]
[im 1/13]
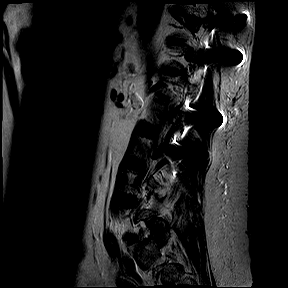
[im 4/13]
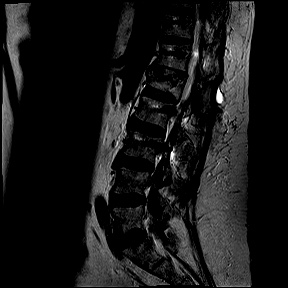
[im 7/13]
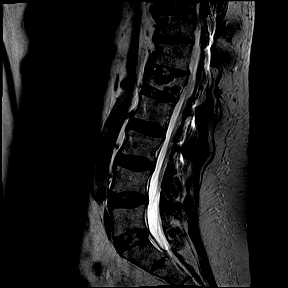
[im 10/13]
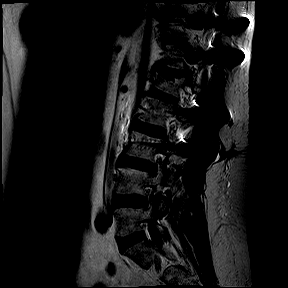
[im 13/13]
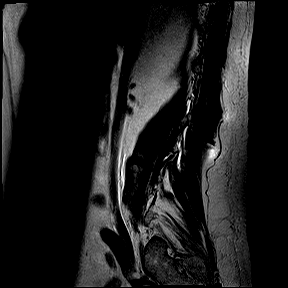

[Series 6: T1 · sagittal · 4.0mm · 1.01mm/px · 5 of 13 slices shown (1 of 2)]
[im 1/13]
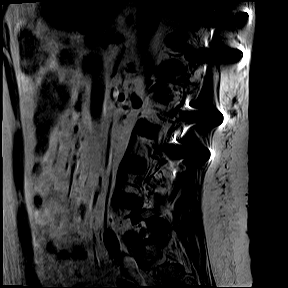
[im 4/13]
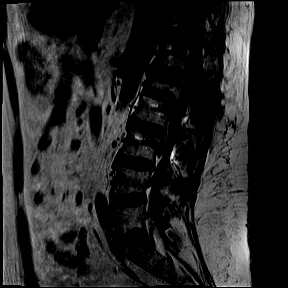
[im 7/13]
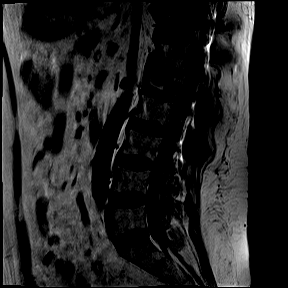
[im 10/13]
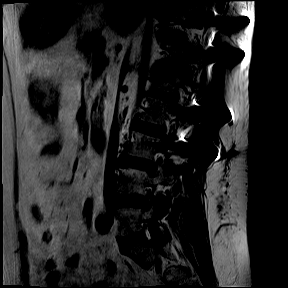
[im 13/13]
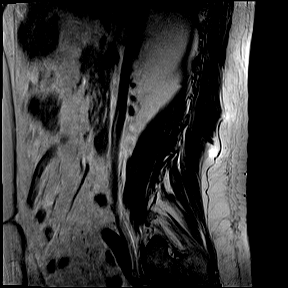

[Series 8: T2 · coronal · 5.0mm · 0.82mm/px · 7 of 18 slices shown (2 of 3)]
[im 1/18]
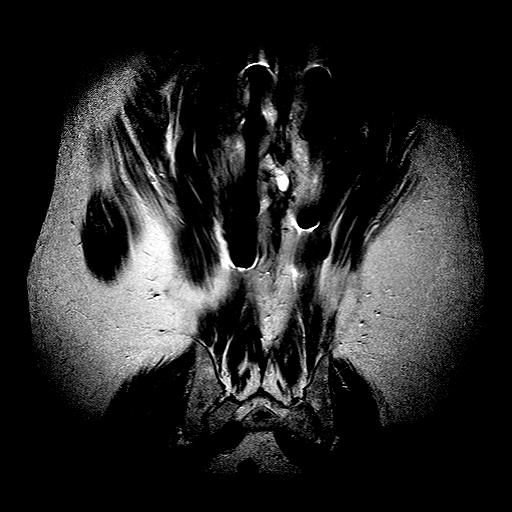
[im 3/18]
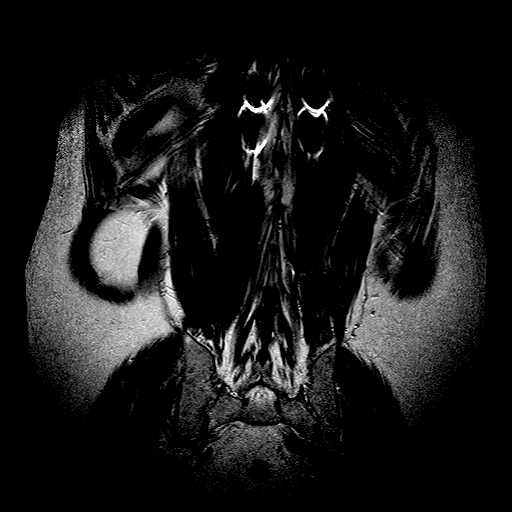
[im 6/18]
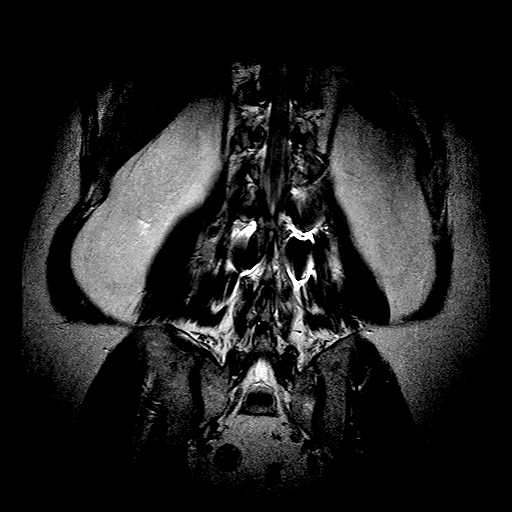
[im 9/18]
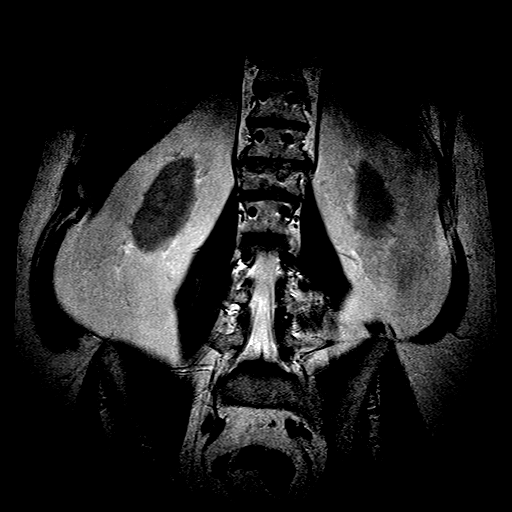
[im 12/18]
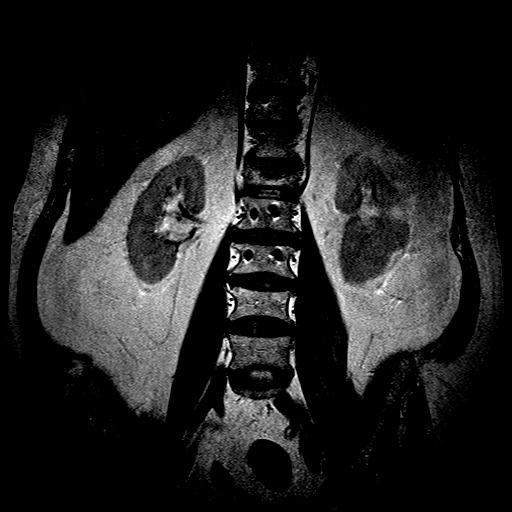
[im 15/18]
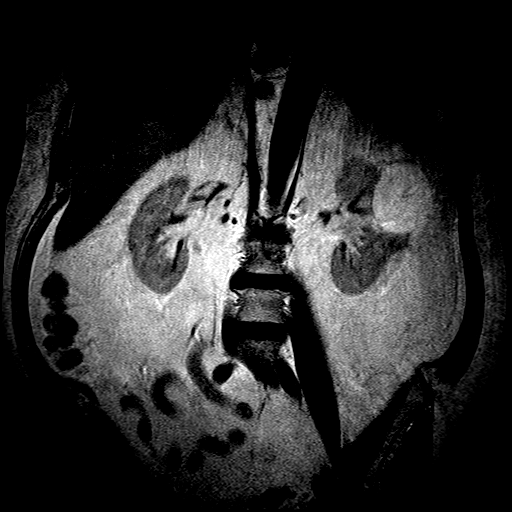
[im 18/18]
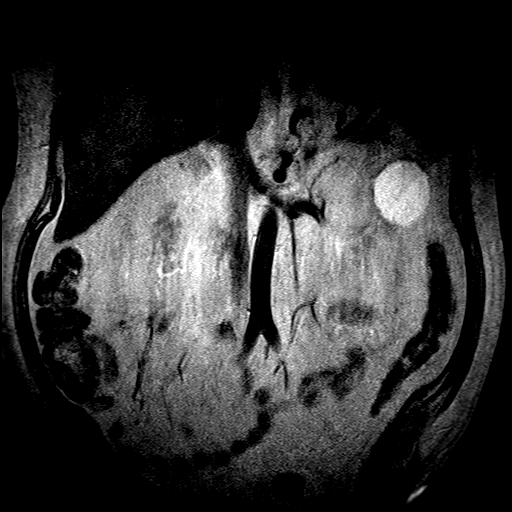

[Series 9: T2 · axial · 4.0mm · 0.52mm/px · z∈[-118,+146]mm · 9 of 32 slices shown (3 of 3)]
[im 1/32]
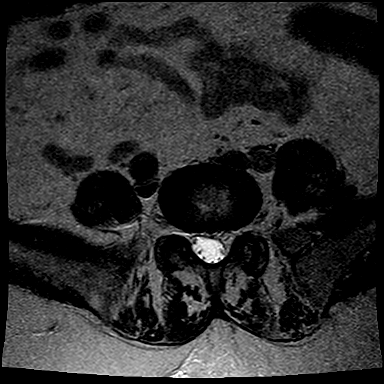
[im 6/32]
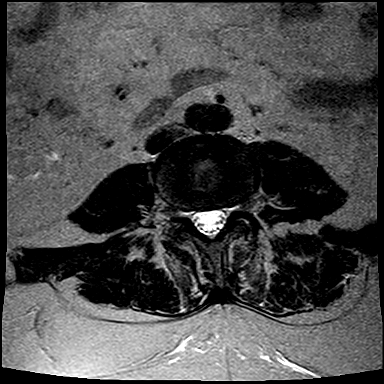
[im 11/32]
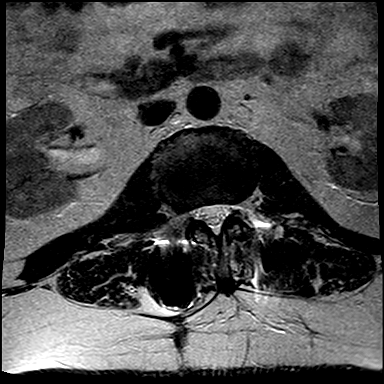
[im 13/32]
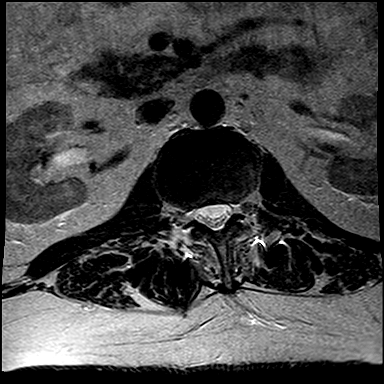
[im 16/32]
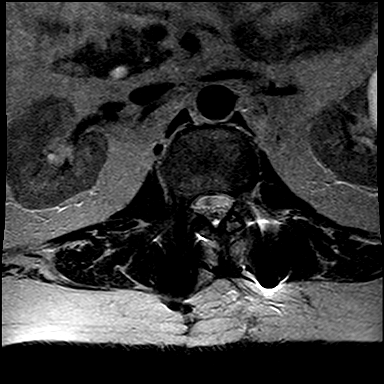
[im 19/32]
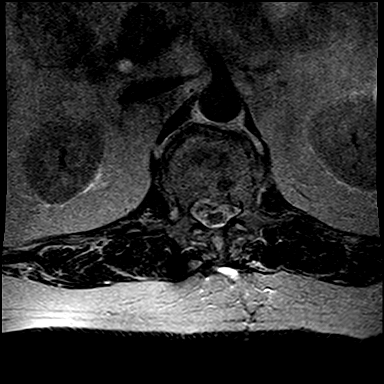
[im 21/32]
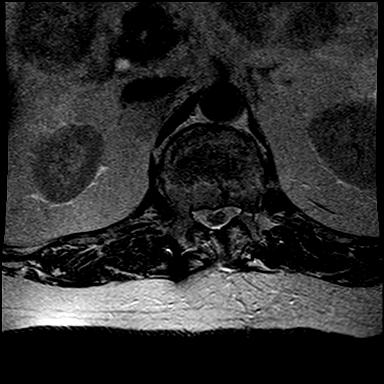
[im 26/32]
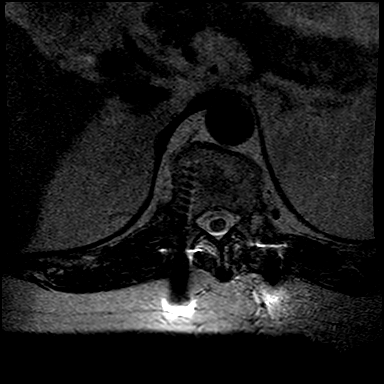
[im 32/32]
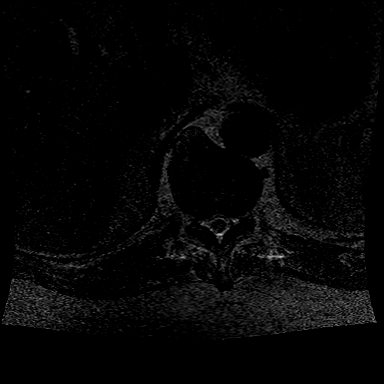

[Series 10: T1 · axial · 4.0mm · 0.52mm/px · z∈[-118,+107]mm · 7 of 32 slices shown (2 of 2)]
[im 1/32]
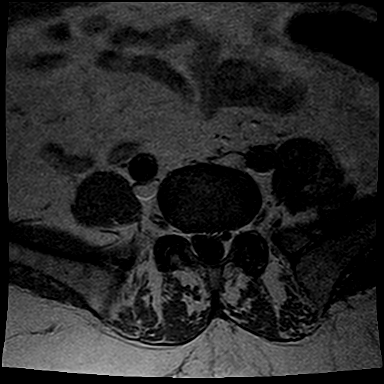
[im 6/32]
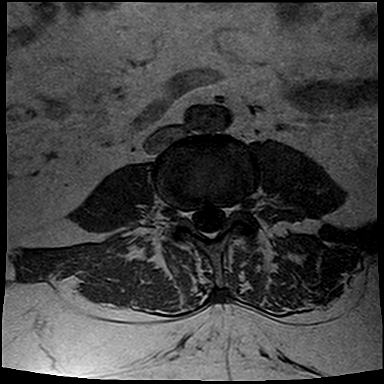
[im 11/32]
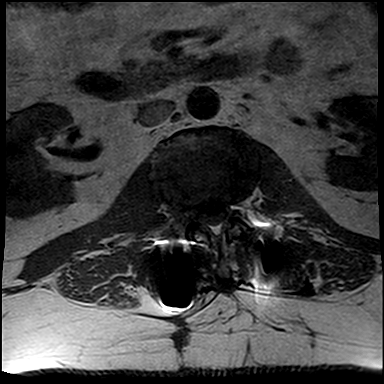
[im 13/32]
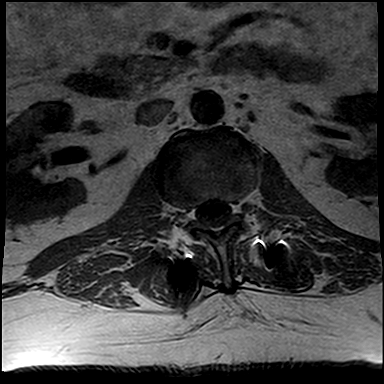
[im 19/32]
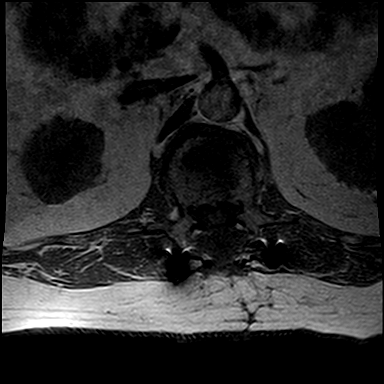
[im 21/32]
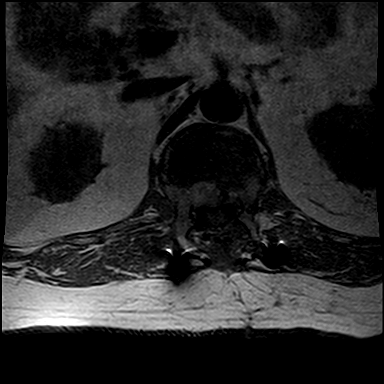
[im 26/32]
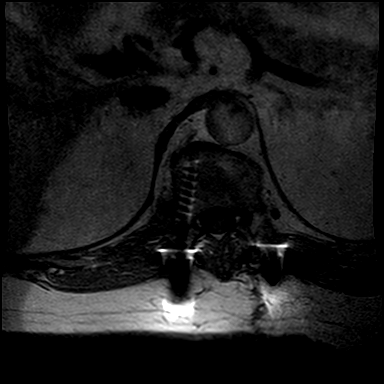

[33 of 48 positions shown; findings below may reference images not displayed]

FINDINGS: Postsurgical changes are noted with metallic hardware transfixing T11 through L3.  There is associated ferromagnetic artifact. 

There is a moderate compression fracture involving the L1 vertebral body.  There is slight retropulsion compressing the thecal sac but not compressing the spinal cord.  

No other fracture is seen.  There are mild degenerative changes at multiple levels.  There is no disc herniation.  The visualized spinal cord appears unremarkable.  Alignment is intact.
IMPRESSION: 1. Postsurgical changes.  

2. L1 compression fracture with mild retropulsion.
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
## 2022-12-22 ENCOUNTER — Emergency Department
Admission: EM | Admit: 2022-12-22 | Discharge: 2022-12-22 | Disposition: A | Discharge status: Home / Self Care | Payer: BC Managed Care – PPO | Attending: PHYSICIAN ASSISTANT | Admitting: PHYSICIAN ASSISTANT

## 2022-12-22 ENCOUNTER — Emergency Department (HOSPITAL_COMMUNITY): Payer: BC Managed Care – PPO

## 2022-12-22 ENCOUNTER — Encounter (HOSPITAL_COMMUNITY): Payer: Self-pay

## 2022-12-22 ENCOUNTER — Other Ambulatory Visit: Payer: Self-pay

## 2022-12-22 DIAGNOSIS — W1789XA Other fall from one level to another, initial encounter: Secondary | ICD-10-CM

## 2022-12-22 DIAGNOSIS — M545 Low back pain, unspecified: Secondary | ICD-10-CM | POA: Insufficient documentation

## 2022-12-22 DIAGNOSIS — S0990XA Unspecified injury of head, initial encounter: Secondary | ICD-10-CM | POA: Insufficient documentation

## 2022-12-22 DIAGNOSIS — M47812 Spondylosis without myelopathy or radiculopathy, cervical region: Secondary | ICD-10-CM | POA: Insufficient documentation

## 2022-12-22 DIAGNOSIS — S32019D Unspecified fracture of first lumbar vertebra, subsequent encounter for fracture with routine healing: Secondary | ICD-10-CM | POA: Insufficient documentation

## 2022-12-22 DIAGNOSIS — W010XXA Fall on same level from slipping, tripping and stumbling without subsequent striking against object, initial encounter: Secondary | ICD-10-CM | POA: Insufficient documentation

## 2022-12-22 DIAGNOSIS — Z981 Arthrodesis status: Secondary | ICD-10-CM | POA: Insufficient documentation

## 2022-12-22 LAB — CBC WITH DIFF
BASOPHIL #: 0 10*3/uL (ref 0.00–0.10)
BASOPHIL %: 0 % (ref 0–1)
EOSINOPHIL #: 0.2 10*3/uL (ref 0.00–0.50)
EOSINOPHIL %: 2 % (ref 1–8)
HCT: 46.1 % (ref 36.7–47.1)
HGB: 15.7 g/dL (ref 12.5–16.3)
LYMPHOCYTE #: 2.1 10*3/uL (ref 1.00–3.00)
LYMPHOCYTE %: 24 % (ref 16–44)
MCH: 29.9 pg (ref 23.8–33.4)
MCHC: 34.1 g/dL (ref 32.5–36.3)
MCV: 87.8 fL (ref 73.0–96.2)
MONOCYTE #: 0.8 10*3/uL (ref 0.30–1.00)
MONOCYTE %: 9 % (ref 5–13)
MPV: 7.7 fL (ref 7.4–11.4)
NEUTROPHIL #: 5.5 10*3/uL (ref 1.85–7.80)
NEUTROPHIL %: 64 % (ref 43–77)
PLATELETS: 283 10*3/uL (ref 140–440)
RBC: 5.24 10*6/uL (ref 4.06–5.63)
RDW: 13.4 % (ref 12.1–16.2)
WBC: 8.6 10*3/uL (ref 3.6–10.2)

## 2022-12-22 LAB — COMPREHENSIVE METABOLIC PANEL, NON-FASTING
ALBUMIN/GLOBULIN RATIO: 1.6 — ABNORMAL HIGH (ref 0.8–1.4)
ALBUMIN: 4.2 g/dL (ref 3.5–5.7)
ALKALINE PHOSPHATASE: 73 U/L (ref 34–104)
ALT (SGPT): 16 U/L (ref 7–52)
ANION GAP: 9 mmol/L (ref 4–13)
AST (SGOT): 18 U/L (ref 13–39)
BILIRUBIN TOTAL: 0.8 mg/dL (ref 0.3–1.0)
BUN/CREA RATIO: 18 (ref 6–22)
BUN: 14 mg/dL (ref 7–25)
CALCIUM, CORRECTED: 9.2 mg/dL (ref 8.9–10.8)
CALCIUM: 9.4 mg/dL (ref 8.6–10.3)
CHLORIDE: 110 mmol/L — ABNORMAL HIGH (ref 98–107)
CO2 TOTAL: 20 mmol/L — ABNORMAL LOW (ref 21–31)
CREATININE: 0.79 mg/dL (ref 0.60–1.30)
ESTIMATED GFR: 100 mL/min/{1.73_m2} (ref >59)
GLOBULIN: 2.7 — ABNORMAL LOW (ref 2.9–5.4)
GLUCOSE: 98 mg/dL (ref 74–109)
OSMOLALITY, CALCULATED: 278 mOsm/kg (ref 270–290)
POTASSIUM: 4.1 mmol/L (ref 3.5–5.1)
PROTEIN TOTAL: 6.9 g/dL (ref 6.4–8.9)
SODIUM: 139 mmol/L (ref 136–145)

## 2022-12-22 LAB — CREATINE KINASE (CK), TOTAL, SERUM OR PLASMA: CREATINE KINASE: 86 U/L (ref 30–223)

## 2022-12-22 LAB — GOLD TOP TUBE

## 2022-12-22 LAB — LACTIC ACID LEVEL W/ REFLEX FOR LEVEL >2.0: LACTIC ACID: 1.9 mmol/L (ref 0.5–2.2)

## 2022-12-22 LAB — BLUE TOP TUBE

## 2022-12-22 MED ORDER — HYDROCODONE 7.5 MG-ACETAMINOPHEN 325 MG TABLET
1.0000 | ORAL_TABLET | ORAL | Status: AC
Start: 2022-12-22 — End: 2022-12-22
  Administered 2022-12-22: 1 via ORAL

## 2022-12-22 MED ORDER — HYDROCODONE 7.5 MG-ACETAMINOPHEN 325 MG TABLET
ORAL_TABLET | ORAL | Status: AC
Start: 2022-12-22 — End: 2022-12-22
  Filled 2022-12-22: qty 1

## 2022-12-22 MED ORDER — HYDROCODONE 5 MG-ACETAMINOPHEN 325 MG TABLET
1.0000 | ORAL_TABLET | Freq: Four times a day (QID) | ORAL | 0 refills | Status: AC | PRN
Start: 2022-12-22 — End: ?

## 2022-12-22 NOTE — ED Nurses Note (Signed)
Patient reports having a possible seizure while getting out of his truck today. Patient reports having nausea and states that he "passed out".     Per provider's orders, C-collar applied to patient's neck.

## 2022-12-22 NOTE — Discharge Instructions (Addendum)
Ice- 15 minutes on/15 minutes off.  Rest.   Use ibuprofen and Tylenol for pain control.  Call neuro surgeons office ASAP to get quick follow-up.  You can not drive or operate machinery while taking pain medication.  Return for worrisome or worsening symptoms.

## 2022-12-22 NOTE — ED Nurses Note (Signed)
Patient discharged home with family. Driver waiting outside. AVS reviewed with patient/care giver.  A written copy of the AVS and discharge instructions was given to the patient/care giver.  Questions sufficiently answered as needed.  Patient/care giver encouraged to follow up with PCP as indicated.  In the event of an emergency, patient/care giver instructed to call 911 or go to the nearest emergency room.

## 2022-12-22 NOTE — ED Triage Notes (Signed)
Larey Seat out of truck missed step . Having back pain with hx rod placement .

## 2022-12-22 NOTE — ED Provider Notes (Signed)
[facility]  ED Primary Provider Note  History of Present Illness   Chief Complaint   Patient presents with    Steven Mendez is a 62 y.o. male who had concerns including Fall.  Patient was trying to get up into his truck, slipped on the running board and fell backwards.  He did hit his head.  Did not lose consciousness.  He is having low back pain.  History of rod placement in his spine.  Denies history cauda equina.  Reports history of neuropathy and has some numbness in his toes.  No more than normal.  He has intact sensation.  Denies incontinence.  Arrival: The patient arrived by Ambulance    HPI  History Reviewed This Encounter: Medical History  Surgical History  Family History  Social History    Physical Exam   ED Triage Vitals [12/22/22 1055]   BP (Non-Invasive) (!) 148/93   Heart Rate 94   Respiratory Rate 18   Temperature 36.7 C (98 F)   SpO2 97 %   Weight 99.8 kg (220 lb)   Height 1.768 m (5' 9.6")     Physical Exam  CONSTITUTIONAL: _Patient resting comfortably on the cot  SKIN: _warm, dry, no jaundice  EYES: _pupils are equally round and reactive to light, extraocular movements intact, clear conjunctiva, non-icteric sclera  HENT: _normocephalic, atraumatic, moist mucus membranes  NECK: _Nontender and supple with no nuchal rigidity, no lymphadenopathy, full range of motion  PULMONARY: _clear to auscultation without wheezes, rhonchi, or rales, normal excursion, no accessory muscle use and no stridor  CARDIOVASCULAR: _regular rate, rhythm, normal S1 and S2. No appreciated murmurs. Strong distal pulses with intact distal perfusion  GASTROINTESTINAL: _soft, non-tender, non-distended, no palpable masses, no rebound or guarding  LYMPHATICS: _no edema in lower extremities, no lymphadenopathy  MUSCULOSKELETAL: _ tenderness palpation paraspinal lumbar region.  Nontender midline palpation of entire spine.  Extremities are nontender to palpation and have no gross  deformity, no edema, redness, or swelling  NEUROLOGIC: _a/o x 3, GCS 15, normal mentation and speech. Moves all extremities x 4 without motor or sensory deficit  PSYCHIATRIC: _normal mood and affect, thought process is clear and linear      Patient Data     Labs Ordered/Reviewed   COMPREHENSIVE METABOLIC PANEL, NON-FASTING - Abnormal; Notable for the following components:       Result Value    CHLORIDE 110 (*)     CO2 TOTAL 20 (*)     ALBUMIN/GLOBULIN RATIO 1.6 (*)     GLOBULIN 2.7 (*)     All other components within normal limits    Narrative:     Estimated Glomerular Filtration Rate (eGFR) is calculated using the CKD-EPI (2021) equation, intended for patients 38 years of age and older. If gender is not documented or "unknown", there will be no eGFR calculation.     CREATINE KINASE (CK), TOTAL, SERUM OR PLASMA - Normal   LACTIC ACID LEVEL W/ REFLEX FOR LEVEL >2.0 - Normal   CBC WITH DIFF - Normal   CBC/DIFF    Narrative:     The following orders were created for panel order CBC/DIFF.  Procedure                               Abnormality         Status                     ---------                               -----------         ------  CBC WITH HCWC[[id number]]                Normal              Final result                 Please view results for these tests on the individual orders.   EXTRA TUBES    Narrative:     The following orders were created for panel order EXTRA TUBES.  Procedure                               Abnormality         Status                     ---------                               -----------         ------                     BLUE TOP VOHY[[id number]]                                    In process                 GOLD TOP RSWN[[id number]]                                    In process                   Please view results for these tests on the individual orders.   BLUE TOP TUBE   GOLD TOP TUBE     CT LUMBAR SPINE WO IV CONTRAST   Final Result by Edi, Radresults In (08/07  1209)   L1 FRACTURE WITH SOME POSTERIOR BULGING INTO THE CANAL STATUS POST LAMINECTOMY AND DECOMPRESSION OF THE CANAL. HEIGHT LOSS TO THE BODY HAS MILDLY INCREASED FROM THE PRIOR EXAM. ALIGNMENT IS STABLE.      THERE ARE NO NEW FRACTURES.         One or more dose reduction techniques were used (e.g., Automated exposure control, adjustment of the mA and/or kV according to patient size, use of iterative reconstruction technique).         Radiologist location ID: [id number]         CT THORACIC SPINE WO IV CONTRAST   Final Result by Edi, Radresults In (08/07 1207)   1. NO ACUTE FRACTURE   2. OLD L1 VERTEBRAL BODY FRACTURE WITH POSTSURGICAL CHANGES FROM LAMINECTOMY AND THORACOLUMBAR FUSION. SURGICAL HARDWARE IS PRESENT.         One or more dose reduction techniques were used (e.g., Automated exposure control, adjustment of the mA and/or kV according to patient size, use of iterative reconstruction technique).         Radiologist location ID: [id number]         CT CERVICAL SPINE WO IV CONTRAST   Final Result by Edi, Radresults In (08/07 1204)   NO ACUTE CERVICAL FRACTURE.  DEGENERATIVE CHANGES.      NONEMERGENT THYROID ULTRASOUND SHOULD BE PERFORMED FOR NODULES.  One or more dose reduction techniques were used (e.g., Automated exposure control, adjustment of the mA and/or kV according to patient size, use of iterative reconstruction technique).         Radiologist location ID: [id number]         CT BRAIN WO IV CONTRAST   Final Result by Edi, Radresults In (08/07 1159)   NO ACUTE FINDINGS         One or more dose reduction techniques were used (e.g., Automated exposure control, adjustment of the mA and/or kV according to patient size, use of iterative reconstruction technique).         Radiologist location ID: [id number]         CT PELVIS WO IV CONTRAST   Final Result by Edi, Radresults In (08/07 1201)   NO CT EVIDENCE OF FRACTURE AT THE PELVIS OR HIPS.         One or more dose reduction techniques were used  (e.g., Automated exposure control, adjustment of the mA and/or kV according to patient size, use of iterative reconstruction technique).         Radiologist location ID: [id number]           Medical Decision Making        Medical Decision Making  Amount and/or Complexity of Data Reviewed  Labs: ordered.  Radiology: ordered.    Risk  Prescription drug management.    62 year old male seen and examined in the emergency department status post fall with closed head injury and back pain.  Vital signs stable.  Physical exam reveals paraspinal tenderness palpation lumbar region.  Patient ambulatory.  Placed in C-collar.  CT head, C-spine, T-spine, L-spine, pelvis negative for acute abnormalities.  L-spine shows L1 mild increased height loss since previous CT imaging.  This is most likely subacute.  Natural progression of previous compression fracture.  Follow-up with neurosurgeon.  He was given closed head injury precautions, Norco for pain.  He is instructed to return to the emergency department if he experiences worrisome or worsening symptoms.  Patient educated as to what those symptoms might be.  Patient acknowledged and is agreeable to plan.  He will be discharged home in stable condition.              Medications Administered in the ED   HYDROcodone-acetaminophen (NORCO) 7.5 mg-325 mg per tablet (has no administration in time range)     Clinical Impression   Closed head injury without loss of consciousness (Primary)   Low back pain       Disposition: Discharged

## 2022-12-22 NOTE — ED Nurses Note (Signed)
C-collar removed by provider at this time.
